# Patient Record
Sex: Male | Born: 1945 | Race: Black or African American | Hispanic: No | Marital: Single
Health system: Southern US, Community
[De-identification: ages and names within clinical notes are randomized; demographics above are authoritative.]

## PROBLEM LIST (undated history)

## (undated) DIAGNOSIS — E119 Type 2 diabetes mellitus without complications: Secondary | ICD-10-CM

## (undated) DIAGNOSIS — F0390 Unspecified dementia without behavioral disturbance: Secondary | ICD-10-CM

---

## 2015-09-11 ENCOUNTER — Inpatient Hospital Stay (HOSPITAL_COMMUNITY)
Admission: EM | Admit: 2015-09-11 | Discharge: 2015-09-21 | DRG: 871 | Disposition: E | Payer: Medicare (Managed Care) | Attending: Oncology | Admitting: Oncology

## 2015-09-11 ENCOUNTER — Emergency Department (HOSPITAL_COMMUNITY): Payer: Medicare (Managed Care)

## 2015-09-11 ENCOUNTER — Encounter (HOSPITAL_COMMUNITY): Payer: Self-pay | Admitting: Emergency Medicine

## 2015-09-11 DIAGNOSIS — Z955 Presence of coronary angioplasty implant and graft: Secondary | ICD-10-CM

## 2015-09-11 DIAGNOSIS — E875 Hyperkalemia: Secondary | ICD-10-CM | POA: Diagnosis present

## 2015-09-11 DIAGNOSIS — I214 Non-ST elevation (NSTEMI) myocardial infarction: Secondary | ICD-10-CM | POA: Diagnosis not present

## 2015-09-11 DIAGNOSIS — IMO0002 Reserved for concepts with insufficient information to code with codable children: Secondary | ICD-10-CM

## 2015-09-11 DIAGNOSIS — E1140 Type 2 diabetes mellitus with diabetic neuropathy, unspecified: Secondary | ICD-10-CM | POA: Diagnosis present

## 2015-09-11 DIAGNOSIS — Z89611 Acquired absence of right leg above knee: Secondary | ICD-10-CM

## 2015-09-11 DIAGNOSIS — Z961 Presence of intraocular lens: Secondary | ICD-10-CM | POA: Diagnosis present

## 2015-09-11 DIAGNOSIS — N179 Acute kidney failure, unspecified: Secondary | ICD-10-CM | POA: Diagnosis present

## 2015-09-11 DIAGNOSIS — Z66 Do not resuscitate: Secondary | ICD-10-CM | POA: Diagnosis present

## 2015-09-11 DIAGNOSIS — R4182 Altered mental status, unspecified: Secondary | ICD-10-CM

## 2015-09-11 DIAGNOSIS — F0150 Vascular dementia without behavioral disturbance: Secondary | ICD-10-CM | POA: Diagnosis present

## 2015-09-11 DIAGNOSIS — K219 Gastro-esophageal reflux disease without esophagitis: Secondary | ICD-10-CM | POA: Diagnosis present

## 2015-09-11 DIAGNOSIS — N400 Enlarged prostate without lower urinary tract symptoms: Secondary | ICD-10-CM | POA: Diagnosis present

## 2015-09-11 DIAGNOSIS — I2510 Atherosclerotic heart disease of native coronary artery without angina pectoris: Secondary | ICD-10-CM | POA: Diagnosis present

## 2015-09-11 DIAGNOSIS — F0390 Unspecified dementia without behavioral disturbance: Secondary | ICD-10-CM | POA: Diagnosis present

## 2015-09-11 DIAGNOSIS — I420 Dilated cardiomyopathy: Secondary | ICD-10-CM | POA: Diagnosis present

## 2015-09-11 DIAGNOSIS — E119 Type 2 diabetes mellitus without complications: Secondary | ICD-10-CM

## 2015-09-11 DIAGNOSIS — Z794 Long term (current) use of insulin: Secondary | ICD-10-CM

## 2015-09-11 DIAGNOSIS — E1122 Type 2 diabetes mellitus with diabetic chronic kidney disease: Secondary | ICD-10-CM | POA: Diagnosis present

## 2015-09-11 DIAGNOSIS — K7290 Hepatic failure, unspecified without coma: Secondary | ICD-10-CM | POA: Diagnosis present

## 2015-09-11 DIAGNOSIS — Z923 Personal history of irradiation: Secondary | ICD-10-CM

## 2015-09-11 DIAGNOSIS — Z89612 Acquired absence of left leg above knee: Secondary | ICD-10-CM

## 2015-09-11 DIAGNOSIS — Z89512 Acquired absence of left leg below knee: Secondary | ICD-10-CM

## 2015-09-11 DIAGNOSIS — F329 Major depressive disorder, single episode, unspecified: Secondary | ICD-10-CM | POA: Diagnosis present

## 2015-09-11 DIAGNOSIS — I472 Ventricular tachycardia, unspecified: Secondary | ICD-10-CM | POA: Insufficient documentation

## 2015-09-11 DIAGNOSIS — E861 Hypovolemia: Secondary | ICD-10-CM | POA: Diagnosis present

## 2015-09-11 DIAGNOSIS — K759 Inflammatory liver disease, unspecified: Secondary | ICD-10-CM | POA: Diagnosis not present

## 2015-09-11 DIAGNOSIS — Z7982 Long term (current) use of aspirin: Secondary | ICD-10-CM

## 2015-09-11 DIAGNOSIS — R6520 Severe sepsis without septic shock: Secondary | ICD-10-CM

## 2015-09-11 DIAGNOSIS — I4901 Ventricular fibrillation: Secondary | ICD-10-CM | POA: Diagnosis not present

## 2015-09-11 DIAGNOSIS — I252 Old myocardial infarction: Secondary | ICD-10-CM

## 2015-09-11 DIAGNOSIS — Z85118 Personal history of other malignant neoplasm of bronchus and lung: Secondary | ICD-10-CM

## 2015-09-11 DIAGNOSIS — I130 Hypertensive heart and chronic kidney disease with heart failure and stage 1 through stage 4 chronic kidney disease, or unspecified chronic kidney disease: Secondary | ICD-10-CM | POA: Diagnosis present

## 2015-09-11 DIAGNOSIS — I509 Heart failure, unspecified: Secondary | ICD-10-CM | POA: Diagnosis present

## 2015-09-11 DIAGNOSIS — S36119A Unspecified injury of liver, initial encounter: Secondary | ICD-10-CM | POA: Diagnosis present

## 2015-09-11 DIAGNOSIS — E11319 Type 2 diabetes mellitus with unspecified diabetic retinopathy without macular edema: Secondary | ICD-10-CM | POA: Diagnosis present

## 2015-09-11 DIAGNOSIS — I4729 Other ventricular tachycardia: Secondary | ICD-10-CM | POA: Insufficient documentation

## 2015-09-11 DIAGNOSIS — R6521 Severe sepsis with septic shock: Secondary | ICD-10-CM

## 2015-09-11 DIAGNOSIS — E860 Dehydration: Secondary | ICD-10-CM | POA: Diagnosis present

## 2015-09-11 DIAGNOSIS — A419 Sepsis, unspecified organism: Principal | ICD-10-CM | POA: Diagnosis present

## 2015-09-11 DIAGNOSIS — K7200 Acute and subacute hepatic failure without coma: Secondary | ICD-10-CM | POA: Diagnosis present

## 2015-09-11 DIAGNOSIS — E872 Acidosis: Secondary | ICD-10-CM | POA: Diagnosis present

## 2015-09-11 DIAGNOSIS — N189 Chronic kidney disease, unspecified: Secondary | ICD-10-CM | POA: Diagnosis present

## 2015-09-11 DIAGNOSIS — R400 Somnolence: Secondary | ICD-10-CM

## 2015-09-11 DIAGNOSIS — E1151 Type 2 diabetes mellitus with diabetic peripheral angiopathy without gangrene: Secondary | ICD-10-CM | POA: Diagnosis present

## 2015-09-11 DIAGNOSIS — I255 Ischemic cardiomyopathy: Secondary | ICD-10-CM | POA: Diagnosis present

## 2015-09-11 DIAGNOSIS — Z8673 Personal history of transient ischemic attack (TIA), and cerebral infarction without residual deficits: Secondary | ICD-10-CM

## 2015-09-11 HISTORY — DX: Unspecified dementia, unspecified severity, without behavioral disturbance, psychotic disturbance, mood disturbance, and anxiety: F03.90

## 2015-09-11 HISTORY — DX: Type 2 diabetes mellitus without complications: E11.9

## 2015-09-11 LAB — TYPE AND SCREEN
ABO/RH(D): B POS
Antibody Screen: NEGATIVE

## 2015-09-11 LAB — URINALYSIS, ROUTINE W REFLEX MICROSCOPIC
Glucose, UA: NEGATIVE mg/dL
Ketones, ur: 15 mg/dL — AB
Nitrite: NEGATIVE
PH: 5 (ref 5.0–8.0)
Protein, ur: 30 mg/dL — AB
SPECIFIC GRAVITY, URINE: 1.021 (ref 1.005–1.030)

## 2015-09-11 LAB — I-STAT VENOUS BLOOD GAS, ED
ACID-BASE DEFICIT: 5 mmol/L — AB (ref 0.0–2.0)
Bicarbonate: 20.1 mEq/L (ref 20.0–24.0)
O2 Saturation: 78 %
PCO2 VEN: 34.7 mmHg — AB (ref 45.0–50.0)
PO2 VEN: 43 mmHg (ref 30.0–45.0)
TCO2: 21 mmol/L (ref 0–100)
pH, Ven: 7.37 — ABNORMAL HIGH (ref 7.250–7.300)

## 2015-09-11 LAB — CBC WITH DIFFERENTIAL/PLATELET
BAND NEUTROPHILS: 0 %
BASOS ABS: 0 10*3/uL (ref 0.0–0.1)
Basophils Relative: 0 %
Blasts: 0 %
EOS ABS: 0 10*3/uL (ref 0.0–0.7)
EOS PCT: 0 %
HCT: 26.3 % — ABNORMAL LOW (ref 39.0–52.0)
Hemoglobin: 8.1 g/dL — ABNORMAL LOW (ref 13.0–17.0)
LYMPHS ABS: 2.2 10*3/uL (ref 0.7–4.0)
Lymphocytes Relative: 12 %
MCH: 27.6 pg (ref 26.0–34.0)
MCHC: 30.8 g/dL (ref 30.0–36.0)
MCV: 89.8 fL (ref 78.0–100.0)
METAMYELOCYTES PCT: 0 %
MONOS PCT: 8 %
Monocytes Absolute: 1.4 10*3/uL — ABNORMAL HIGH (ref 0.1–1.0)
Myelocytes: 0 %
NEUTROS ABS: 14.5 10*3/uL — AB (ref 1.7–7.7)
Neutrophils Relative %: 80 %
Other: 0 %
PLATELETS: 141 10*3/uL — AB (ref 150–400)
Promyelocytes Absolute: 0 %
RBC: 2.93 MIL/uL — ABNORMAL LOW (ref 4.22–5.81)
RDW: 16.7 % — AB (ref 11.5–15.5)
WBC: 18.1 10*3/uL — ABNORMAL HIGH (ref 4.0–10.5)
nRBC: 43 /100 WBC — ABNORMAL HIGH

## 2015-09-11 LAB — URINE MICROSCOPIC-ADD ON

## 2015-09-11 LAB — I-STAT TROPONIN, ED: Troponin i, poc: 8.49 ng/mL (ref 0.00–0.08)

## 2015-09-11 LAB — COMPREHENSIVE METABOLIC PANEL
ALK PHOS: 375 U/L — AB (ref 38–126)
ALT: 2249 U/L — AB (ref 17–63)
AST: 2584 U/L — ABNORMAL HIGH (ref 15–41)
Albumin: 2.7 g/dL — ABNORMAL LOW (ref 3.5–5.0)
Anion gap: 19 — ABNORMAL HIGH (ref 5–15)
BUN: 105 mg/dL — ABNORMAL HIGH (ref 6–20)
CALCIUM: 7.8 mg/dL — AB (ref 8.9–10.3)
CO2: 18 mmol/L — AB (ref 22–32)
CREATININE: 3.25 mg/dL — AB (ref 0.61–1.24)
Chloride: 101 mmol/L (ref 101–111)
GFR calc non Af Amer: 18 mL/min — ABNORMAL LOW (ref >60)
GFR, EST AFRICAN AMERICAN: 21 mL/min — AB (ref >60)
Glucose, Bld: 167 mg/dL — ABNORMAL HIGH (ref 65–99)
Potassium: 6.8 mmol/L (ref 3.5–5.1)
SODIUM: 138 mmol/L (ref 135–145)
Total Bilirubin: 5.5 mg/dL — ABNORMAL HIGH (ref 0.3–1.2)
Total Protein: 6.4 g/dL — ABNORMAL LOW (ref 6.5–8.1)

## 2015-09-11 LAB — AMMONIA: AMMONIA: 35 umol/L (ref 9–35)

## 2015-09-11 LAB — PROTIME-INR
INR: 2.43 — AB (ref 0.00–1.49)
Prothrombin Time: 26.1 seconds — ABNORMAL HIGH (ref 11.6–15.2)

## 2015-09-11 LAB — LIPASE, BLOOD: Lipase: 61 U/L — ABNORMAL HIGH (ref 11–51)

## 2015-09-11 LAB — I-STAT CHEM 8, ED
BUN: 99 mg/dL — AB (ref 6–20)
CALCIUM ION: 0.94 mmol/L — AB (ref 1.13–1.30)
CHLORIDE: 100 mmol/L — AB (ref 101–111)
Creatinine, Ser: 3.3 mg/dL — ABNORMAL HIGH (ref 0.61–1.24)
GLUCOSE: 161 mg/dL — AB (ref 65–99)
HEMATOCRIT: 27 % — AB (ref 39.0–52.0)
Hemoglobin: 9.2 g/dL — ABNORMAL LOW (ref 13.0–17.0)
Potassium: 6.4 mmol/L (ref 3.5–5.1)
Sodium: 136 mmol/L (ref 135–145)
TCO2: 19 mmol/L (ref 0–100)

## 2015-09-11 LAB — I-STAT CG4 LACTIC ACID, ED: LACTIC ACID, VENOUS: 8.55 mmol/L — AB (ref 0.5–2.0)

## 2015-09-11 LAB — ABO/RH: ABO/RH(D): B POS

## 2015-09-11 LAB — CBG MONITORING, ED: Glucose-Capillary: 133 mg/dL — ABNORMAL HIGH (ref 65–99)

## 2015-09-11 LAB — POC OCCULT BLOOD, ED: Fecal Occult Bld: NEGATIVE

## 2015-09-11 LAB — BRAIN NATRIURETIC PEPTIDE

## 2015-09-11 MED ORDER — ASPIRIN 300 MG RE SUPP
300.0000 mg | Freq: Once | RECTAL | Status: AC
  Administered 2015-09-11: 300 mg via RECTAL
  Filled 2015-09-11: qty 1

## 2015-09-11 MED ORDER — PIPERACILLIN-TAZOBACTAM 3.375 G IVPB 30 MIN
3.3750 g | Freq: Once | INTRAVENOUS | Status: AC
  Administered 2015-09-11: 3.375 g via INTRAVENOUS
  Filled 2015-09-11: qty 50

## 2015-09-11 MED ORDER — CALCIUM GLUCONATE 10 % IV SOLN
1.0000 g | Freq: Once | INTRAVENOUS | Status: AC
  Administered 2015-09-11: 1 g via INTRAVENOUS
  Filled 2015-09-11: qty 10

## 2015-09-11 MED ORDER — INSULIN ASPART 100 UNIT/ML IV SOLN
10.0000 [IU] | Freq: Once | INTRAVENOUS | Status: AC
  Administered 2015-09-11: 10 [IU] via INTRAVENOUS
  Filled 2015-09-11: qty 1

## 2015-09-11 MED ORDER — SODIUM CHLORIDE 0.9 % IV SOLN: 1000.0000 mL | INTRAVENOUS | Status: DC

## 2015-09-11 MED ORDER — HEPARIN (PORCINE) IN NACL 100-0.45 UNIT/ML-% IJ SOLN
700.0000 [IU]/h | INTRAMUSCULAR | Status: DC
  Administered 2015-09-11: 700 [IU]/h via INTRAVENOUS
  Filled 2015-09-11: qty 250

## 2015-09-11 MED ORDER — SODIUM CHLORIDE 0.9 % IV BOLUS (SEPSIS): 500.0000 mL | Freq: Once | INTRAVENOUS | Status: DC

## 2015-09-11 MED ORDER — ALBUTEROL SULFATE (2.5 MG/3ML) 0.083% IN NEBU
10.0000 mg | INHALATION_SOLUTION | Freq: Once | RESPIRATORY_TRACT | Status: AC
  Administered 2015-09-11: 10 mg via RESPIRATORY_TRACT
  Filled 2015-09-11: qty 12

## 2015-09-11 MED ORDER — SODIUM CHLORIDE 0.9 % IV BOLUS (SEPSIS)
500.0000 mL | Freq: Once | INTRAVENOUS | Status: AC
  Administered 2015-09-11: 500 mL via INTRAVENOUS

## 2015-09-11 MED ORDER — ASPIRIN 81 MG PO CHEW: 324.0000 mg | CHEWABLE_TABLET | Freq: Once | ORAL | Status: DC

## 2015-09-11 MED ORDER — SODIUM CHLORIDE 0.9 % IV SOLN
1000.0000 mL | Freq: Once | INTRAVENOUS | Status: AC
  Administered 2015-09-12: 1000 mL via INTRAVENOUS

## 2015-09-11 MED ORDER — SODIUM CHLORIDE 0.9 % IV SOLN
1000.0000 mL | Freq: Once | INTRAVENOUS | Status: AC
  Administered 2015-09-11: 1000 mL via INTRAVENOUS

## 2015-09-11 MED ORDER — VANCOMYCIN HCL IN DEXTROSE 1-5 GM/200ML-% IV SOLN
1000.0000 mg | INTRAVENOUS | Status: AC
  Administered 2015-09-12: 1000 mg via INTRAVENOUS
  Filled 2015-09-11: qty 200

## 2015-09-11 MED ORDER — PIPERACILLIN-TAZOBACTAM IN DEX 2-0.25 GM/50ML IV SOLN
2.2500 g | Freq: Three times a day (TID) | INTRAVENOUS | Status: DC
  Administered 2015-09-12 – 2015-09-13 (×6): 2.25 g via INTRAVENOUS
  Filled 2015-09-11 (×11): qty 50

## 2015-09-11 MED ORDER — HEPARIN BOLUS VIA INFUSION
3000.0000 [IU] | Freq: Once | INTRAVENOUS | Status: AC
  Administered 2015-09-11: 3000 [IU] via INTRAVENOUS
  Filled 2015-09-11: qty 3000

## 2015-09-11 MED ORDER — VANCOMYCIN HCL IN DEXTROSE 1-5 GM/200ML-% IV SOLN
1000.0000 mg | INTRAVENOUS | Status: DC
  Administered 2015-09-13: 1000 mg via INTRAVENOUS
  Filled 2015-09-11: qty 200

## 2015-09-11 MED ORDER — SODIUM POLYSTYRENE SULFONATE 15 GM/60ML PO SUSP: 30.0000 g | Freq: Once | ORAL | Status: DC

## 2015-09-11 MED ORDER — DEXTROSE 50 % IV SOLN
1.0000 | Freq: Once | INTRAVENOUS | Status: AC
  Administered 2015-09-11: 50 mL via INTRAVENOUS
  Filled 2015-09-11: qty 50

## 2015-09-11 NOTE — ED Notes (Signed)
Spoke with Edson Snowballngelina, daughter, sts she makes her father's healthcare decisions. Sts father is full code. Sts she will be here in the morning to see him.

## 2015-09-11 NOTE — Progress Notes (Signed)
ANTICOAGULATION CONSULT NOTE - Initial Consult  Pharmacy Consult for heparin Indication: chest pain/ACS  No Known Allergies  Patient Measurements:   Heparin Dosing Weight: 59.8kg  Vital Signs: Temp: 97.4 F (36.3 C) (11/21 1744) Temp Source: Oral (11/21 1744) BP: 97/31 mmHg (11/21 2000) Pulse Rate: 92 (11/21 2030)  Labs:  Recent Labs  June 21, 2015 1930 June 21, 2015 1939  HGB 8.1* 9.2*  HCT 26.3* 27.0*  PLT 141*  --   LABPROT 26.1*  --   INR 2.43*  --   CREATININE  --  3.30*    CrCl cannot be calculated (Unknown ideal weight.).   Medical History: Past Medical History  Diagnosis Date  . Dementia   . Diabetes mellitus without complication (HCC)     Assessment: 69 yom to ED with AMS, elevated troponin. Pharmacy consulted to dose heparin for ACS/CP (no anticoag pta). Patient has bilat AKA. Hg 9.2, plt 141 on admit. No bleed documented.  Goal of Therapy:  Heparin level 0.3-0.7 units/ml Monitor platelets by anticoagulation protocol: Yes   Plan:  Heparin 3000 unit bolus Heparin @ 700 units/h 6h HL Daily HL/CBC Mon s/sx bleeding  Babs BertinHaley Thorn Demas, PharmD Clinical Pharmacist Pager [phone] 2015/07/11 8:45 PM

## 2015-09-11 NOTE — ED Provider Notes (Signed)
CSN: [id number]     Arrival date & time 09/10/2015  1732 History   First MD Initiated Contact with Patient 09/17/2015 1734     Chief Complaint  Patient presents with  . Altered Mental Status     (Consider location/radiation/quality/duration/timing/severity/associated sxs/prior Treatment) HPI The patient comes from a facility called [facility]. He has just recently become a patient with them. This is an adult day care facility with medical care associated. Patient has multiple, complex medical history. This includes diabetes, coronary artery disease, bilateral lower extremity amputations, dementia. At baseline however the patient is pleasantly interactive and conversant. A distinct change was noted and the patient today. I reviewed his case with Dr. Mayford Knife who evaluated the patient at this facility. She reports that the patient's daughter noted some changes over the weekend. She had the impression he was constipated and thus he was given over-the-counter preparation. Since then he has had several loose stools and been incontinent of stool. Dr. Mayford Knife reports that the patient seemed clinically dehydrated and they had gotten labs as well indicating renal insufficiency and hyperkalemia. While at the facility she reports he drank 2 L of fluids eagerly. They also noted EKG abnormality and have sent the patient to the emergency department for treatment. Up to this point, the patient's daughter has not been present for additional history from the weekend. Past Medical History  Diagnosis Date  . Dementia   . Diabetes mellitus without complication (HCC)    History reviewed. No pertinent past surgical history. History reviewed. No pertinent family history. Social History  Substance Use Topics  . Smoking status: Unknown If Ever Smoked  . Smokeless tobacco: None  . Alcohol Use: None    Review of Systems Patient cannot provide, level V caveat dementia/severe illness   Allergies  Review of patient's  allergies indicates no known allergies.  Home Medications   Prior to Admission medications   Medication Sig Start Date End Date Taking? Authorizing Provider  acetaminophen (TYLENOL) 500 MG tablet Take 500 mg by mouth every 4 (four) hours as needed for mild pain.   Yes Historical Provider, MD  aspirin 81 MG tablet Take 81 mg by mouth daily.   Yes Historical Provider, MD  DULoxetine (CYMBALTA) 30 MG capsule Take 30 mg by mouth daily.   Yes Historical Provider, MD  famotidine (PEPCID) 20 MG tablet Take 20 mg by mouth 2 (two) times daily.   Yes Historical Provider, MD  insulin glargine (LANTUS) 100 UNIT/ML injection Inject 25 Units into the skin at bedtime.    Yes Historical Provider, MD  lisinopril (PRINIVIL,ZESTRIL) 5 MG tablet Take 5 mg by mouth daily.   Yes Historical Provider, MD  metFORMIN (GLUCOPHAGE) 1000 MG tablet Take 1,000 mg by mouth 2 (two) times daily with a meal.   Yes Historical Provider, MD  metoprolol succinate (TOPROL-XL) 50 MG 24 hr tablet Take 50 mg by mouth daily. Take with or immediately following a meal.   Yes Historical Provider, MD  nicotine (NICODERM CQ - DOSED IN MG/24 HR) 7 mg/24hr patch Place 7 mg onto the skin daily.   Yes Historical Provider, MD  Nutritional Supplements (FEEDING SUPPLEMENT, GLUCERNA 1.2 CAL,) LIQD Place 237 mLs into feeding tube 2 (two) times daily.   Yes Historical Provider, MD  polyethylene glycol (MIRALAX / GLYCOLAX) packet Take 17 g by mouth daily.   Yes Historical Provider, MD  pravastatin (PRAVACHOL) 40 MG tablet Take 40 mg by mouth at bedtime.   Yes Historical Provider, MD  senna (SENOKOT) 8.6 MG tablet Take 1 tablet by mouth at bedtime.   Yes Historical Provider, MD   BP 127/52 mmHg  Pulse 94  Temp(Src) 97.4 F (36.3 C) (Oral)  Resp 16  Wt 131 lb 13.4 oz (59.8 kg)  SpO2 100% Physical Exam  Constitutional:  Patient is very thin, confused, no respiratory distress. Pale appearance.  HENT:  Head: Normocephalic and atraumatic.  Right  Ear: External ear normal.  Left Ear: External ear normal.  Oropharynx is slightly dry but clear. Patient is making intermittent vocalizations.  Eyes: EOM are normal. Pupils are equal, round, and reactive to light. Scleral icterus is present.  Neck: Neck supple.  Cardiovascular: Normal rate, regular rhythm, normal heart sounds and intact distal pulses.   Pulmonary/Chest: Effort normal and breath sounds normal.  Abdominal: Soft. He exhibits no distension. There is tenderness.  Patient seems uncomfortable palpation but cannot seem to localize.  Genitourinary:  Rectal examination: Soft pale brownish yellow stool in the vault. No impaction.  Musculoskeletal: Normal range of motion. He exhibits no edema.  Patient can move all extremities. He has both lower extremities amputated. They have dictation sites are clean and dry and intact. There is no edema. His back and buttocks are good condition without wounds or breakdown.  Neurological: He has normal strength. GCS eye subscore is 4. GCS verbal subscore is 5. GCS motor subscore is 6.  Patient is confused. Makes occasional vocalizations it makes sense but cannot answer questions. He predominantly perseverates the last thing said. He does assistance him moving about in the stretcher. He spontaneously moves his extremities. He is too confused to follow commands for extremity motor testing.  Skin: Skin is dry and intact.  Patient is pale in appearance, extremities are slightly cool to touch.  Psychiatric:  Patient is cooperative but confused.    ED Course  Procedures (including critical care time) CRITICAL CARE Performed by: Arby Barrette   Total critical care time: 60 minutes  Critical care time was exclusive of separately billable procedures and treating other patients.  Critical care was necessary to treat or prevent imminent or life-threatening deterioration.  Critical care was time spent personally by me on the following activities:  development of treatment plan with patient and/or surrogate as well as nursing, discussions with consultants, evaluation of patient's response to treatment, examination of patient, obtaining history from patient or surrogate, ordering and performing treatments and interventions, ordering and review of laboratory studies, ordering and review of radiographic studies, pulse oximetry and re-evaluation of patient's condition. Labs Review Labs Reviewed  COMPREHENSIVE METABOLIC PANEL - Abnormal; Notable for the following:    Potassium 6.8 (*)    CO2 18 (*)    Glucose, Bld 167 (*)    BUN 105 (*)    Creatinine, Ser 3.25 (*)    Calcium 7.8 (*)    Total Protein 6.4 (*)    Albumin 2.7 (*)    AST 2584 (*)    ALT 2249 (*)    Alkaline Phosphatase 375 (*)    Total Bilirubin 5.5 (*)    GFR calc non Af Amer 18 (*)    GFR calc Af Amer 21 (*)    Anion gap 19 (*)    All other components within normal limits  LIPASE, BLOOD - Abnormal; Notable for the following:    Lipase 61 (*)    All other components within normal limits  BRAIN NATRIURETIC PEPTIDE - Abnormal; Notable for the following:    B Natriuretic Peptide >  4500.0 (*)    All other components within normal limits  CBC WITH DIFFERENTIAL/PLATELET - Abnormal; Notable for the following:    WBC 18.1 (*)    RBC 2.93 (*)    Hemoglobin 8.1 (*)    HCT 26.3 (*)    RDW 16.7 (*)    Platelets 141 (*)    nRBC 43 (*)    Neutro Abs 14.5 (*)    Monocytes Absolute 1.4 (*)    All other components within normal limits  PROTIME-INR - Abnormal; Notable for the following:    Prothrombin Time 26.1 (*)    INR 2.43 (*)    All other components within normal limits  URINALYSIS, ROUTINE W REFLEX MICROSCOPIC (NOT AT [facility]) - Abnormal; Notable for the following:    Color, Urine AMBER (*)    APPearance CLOUDY (*)    Hgb urine dipstick SMALL (*)    Bilirubin Urine MODERATE (*)    Ketones, ur 15 (*)    Protein, ur 30 (*)    Leukocytes, UA SMALL (*)    All other  components within normal limits  URINE MICROSCOPIC-ADD ON - Abnormal; Notable for the following:    Squamous Epithelial / LPF 0-5 (*)    Bacteria, UA RARE (*)    Casts GRANULAR CAST (*)    All other components within normal limits  CBG MONITORING, ED - Abnormal; Notable for the following:    Glucose-Capillary 133 (*)    All other components within normal limits  I-STAT TROPOININ, ED - Abnormal; Notable for the following:    Troponin i, poc 8.49 (*)    All other components within normal limits  I-STAT CHEM 8, ED - Abnormal; Notable for the following:    Potassium 6.4 (*)    Chloride 100 (*)    BUN 99 (*)    Creatinine, Ser 3.30 (*)    Glucose, Bld 161 (*)    Calcium, Ion 0.94 (*)    Hemoglobin 9.2 (*)    HCT 27.0 (*)    All other components within normal limits  I-STAT VENOUS BLOOD GAS, ED - Abnormal; Notable for the following:    pH, Ven 7.370 (*)    pCO2, Ven 34.7 (*)    Acid-base deficit 5.0 (*)    All other components within normal limits  I-STAT CG4 LACTIC ACID, ED - Abnormal; Notable for the following:    Lactic Acid, Venous 8.55 (*)    All other components within normal limits  CULTURE, BLOOD (ROUTINE X 2)  CULTURE, BLOOD (ROUTINE X 2)  AMMONIA  BLOOD GAS, VENOUS  PROCALCITONIN  CBG MONITORING, ED  POC OCCULT BLOOD, ED  TYPE AND SCREEN  ABO/RH    Imaging Review Dg Chest Port 1 View  09/15/2015  CLINICAL DATA:  69 year old male with altered mental status EXAM: PORTABLE CHEST 1 VIEW COMPARISON:  None. FINDINGS: Mild cardiomegaly with left heart enlargement. Atherosclerotic calcifications present in the transverse aorta. The patient is rotated to the left which distorts the cardiac and mediastinal contours. Mediastinal contours are grossly within normal limits given this distortion. Low inspiratory volumes with mild bibasilar atelectasis. No pulmonary edema, pleural effusion or pneumothorax. No focal airspace consolidation. No acute osseous abnormality. IMPRESSION: 1.  Low inspiratory volumes with mild bibasilar atelectasis. 2. Borderline cardiomegaly. 3. Aortic atherosclerosis. Electronically Signed   By: Malachy Moan M.D.   On: 09/02/2015 18:12   I have personally reviewed and evaluated these images and lab results as part of my medical decision-making.  EKG Interpretation   Date/Time:  Monday September 11 2015 17:33:23 EST Ventricular Rate:  84 PR Interval:  168 QRS Duration: 111 QT Interval:  416 QTC Calculation: 492 R Axis:   112 Text Interpretation:  Sinus rhythm Probable right ventricular hypertrophy  Inferior infarct, age indeterminate Repol abnrm, severe global ischemia  (LM/MVD) ischemic changes. Confirmed by Donnald GarrePfeiffer, MD, Lebron ConnersMarcy [phone]([id number]) on  May 19, 2015 5:36:20 PM      MDM   Final diagnoses:  Multisystem organ failure   Patient is critically ill. Suspicion is for ascending cholangitis as a primary source of infection. A she is significantly elevated LFTs with concomitant multisystem organ failure. EKG has ischemic appearance and troponin is significant only elevated. Patient has acute renal failure and hyperkalemia. Intensivist was consult it and evaluated the patient. Dr. Gomez CleverlyBurnham has discussed the patient's case with the patient's daughter who has not been present yet. She has been made aware of the severity of her father's illness and determined that at this time he will be DO NOT RESUSCITATE for no intubation, no central lines and no pressors. Patient will be admitted to the internal medicine resident service for temporizing, supportive measures.    Arby BarretteMarcy Fairley Copher, MD 09/12/15 [phone]

## 2015-09-11 NOTE — ED Notes (Signed)
[facility] with c/o altered LOC. According to facility pt is normally talkative, not at this time. Pt groaing at this time. Lab work from facility shows increased WBC count, decreased HGB, increased potassium. Pt also has EKG changes. Pt sts pain in abdomen, chest. Hypotension noted upon arrival.

## 2015-09-11 NOTE — Consult Note (Addendum)
CARDIOLOGY CONSULT NOTE   Patient ID: Gary Wagner MRN: [id number], DOB/AGE: Feb 28, 1946   Admit date: 09/13/2015 Date of Consult: 08/24/2015   Primary Physician: No PCP Per Patient Primary Cardiologist: None  Pt. 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   Problem List  Past Medical History  Diagnosis Date  . Dementia   . Diabetes mellitus without complication ([place])     History reviewed. No pertinent past surgical history.   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   Gary Wagner is a resident at [facility]. At baseline, he needs some assistance with grooming, transfers. Can bathe himself for the most part. Unable to cook, shop, do finances, laundry, or housekeeping. Per adult daycare paperwork, he is typically an accurate historian and is interactive. The patient's daughter noted some small changes over the weekend with was thought he was constipated and was given some OTC laxatives.Staff noticed a more distinct mental status change today and he was evaluated by a physician at the facility. There was concern that the patient was dehydrated. Labs were sent and the patient was given oral fluids (2L) which he apparently consumed quickly. Labs demonstrated AKI, metabolic disarray with hyperkalemia and ECG  changes with peaked t waves, and the patient was transferred to [facility] for further evaluation.    On arrival to the ER, he was hypotensive to 90/66, P 82, 97.60F, 95% on RA. He would intermittent respond to verbal stimuli and was unable to follow any commands. Labs were notable for K 6.8, Cr 3.25, BUN 105, Alk phos 375, AST 2584, ALT 2249, albumin 2.7, TB 5.5, POC TnI 8.49, WBC 18.1, hgb 8.1, INR 2.43, BNP >4500. UA with small LE, negative nitrate, moderate granular casts, 0-5WBCs. ECG demonstrated NSR, prolonged QTc, some peaking of T waves, ST depression in lateral leads, old MI (inferior Q waves) with probably posterior extension (prominent R wave in V1). 69m STE in aVR. CXR demonstrated no acute process. I was unable to obtain any history or ROS given the degree of AMS.   Inpatient Medications     Family History History reviewed. No pertinent family history.   Social History Social History   Social History  . Marital Status: Single    Spouse Name: N/A  . Number of Children: N/A  . Years of Education: N/A   Occupational History  . Not on file.   Social History Main Topics  . Smoking status: Unknown If Ever Smoked  . Smokeless tobacco: Not on file  . Alcohol Use: Not on file  . Drug Use: Not on file  . Sexual Activity: Not on file   Other Topics Concern  . Not on file   Social History Narrative  . No narrative on file     Review of  Systems  Unable to perform  Physical Exam  Blood pressure 97/31, pulse 92, temperature 97.4 F (36.3 C), temperature source Oral, resp. rate 21, SpO2 59 %.  General: Thin, frail,  Psych: Tired, confused Neuro: intermittently responding (non-sensically) to voice HEENT: Normal  Neck: Supple, no JVD. Lungs:  Resp regular and unlabored, CTA. Heart: RRR no s3, s4, or murmurs. Abdomen: Soft, non-tender, non-distended, BS + x 4.  Extremities: s/p L BKA and R AKA. Well healed stumps. Warm extremities w/o edema.    Labs  No results for  input(s): CKTOTAL, CKMB, TROPONINI in the last 72 hours. Lab Results  Component Value Date   WBC 18.1* 09/02/2015   HGB 9.2* 09/09/2015   HCT 27.0* 09/07/2015   MCV 89.8 09/08/2015   PLT 141* 09/08/2015    Recent Labs Lab 09/17/2015 1930 09/10/2015 1939  NA 138 136  K 6.8* 6.4*  CL 101 100*  CO2 18*  --   BUN 105* 99*  CREATININE 3.25* 3.30*  CALCIUM 7.8*  --   PROT 6.4*  --   BILITOT 5.5*  --   ALKPHOS 375*  --   ALT PENDING  --   AST PENDING  --   GLUCOSE 167* 161*   No results found for: CHOL, HDL, LDLCALC, TRIG No results found for: DDIMER  Radiology/Studies  Dg Chest Port 1 View  08/27/2015  CLINICAL DATA:  69 year old male with altered mental status EXAM: PORTABLE CHEST 1 VIEW COMPARISON:  None. FINDINGS: Mild cardiomegaly with left heart enlargement. Atherosclerotic calcifications present in the transverse aorta. The patient is rotated to the left which distorts the cardiac and mediastinal contours. Mediastinal contours are grossly within normal limits given this distortion. Low inspiratory volumes with mild bibasilar atelectasis. No pulmonary edema, pleural effusion or pneumothorax. No focal airspace consolidation. No acute osseous abnormality. IMPRESSION: 1. Low inspiratory volumes with mild bibasilar atelectasis. 2. Borderline cardiomegaly. 3. Aortic atherosclerosis. Electronically Signed   By: Jacqulynn Cadet M.D.   On: 09/02/2015 18:12    ECG  NSR, prolonged QTc, some peaking of T waves, ST depression in lateral leads, old MI (inferior Q waves) with probably posterior extension (prominent R wave in V1). 69m STE in aVR.  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 It is likely that the NSTEMI is secondary to systemic hypoperfusion and the degree of troponin elevated related in part to the AKI. He appears to be hypovolemic and may be infected. AMS is likely driven by metabolic disarray with uremia. He has multiorgan system failure and is a poor candidate for anything other than a conservative medical approach to his NSTEMI.   1. ASA 846mdaily 2. Discontinue IV heparin given elevated INR and acute liver failure 3. No statin given acute liver injury 4. No beta blocker given shock 5. No ACE or spiro due to AKI and shock 6. TTE in AM for risk stratification 7. Although he has a reduced ejection fraction, he appears hypovolemic and would benefit from ~2 liters of additional fluid over the next 12 hours. Will need to keep a close on on respiratory status with IV fluid administration.   Signed, Ajdin Macke,  Lucendia Leard, MD 08/22/2015, 8:59 PM

## 2015-09-11 NOTE — Progress Notes (Signed)
ANTIBIOTIC CONSULT NOTE - INITIAL  Pharmacy Consult for Vancomycin and Zosyn Indication: sepsis (PNA likely source)  No Known Allergies  Patient Measurements: Weight: 131 lb 13.4 oz (59.8 kg)  Vital Signs: Temp: 97.4 F (36.3 C) (11/21 1744) Temp Source: Oral (11/21 1744) BP: 127/52 mmHg (11/21 2045) Pulse Rate: 94 (11/21 2045) Intake/Output from previous day:   Intake/Output from this shift: Total I/O In: -  Out: 175 [Urine:175]  Labs:  Recent Labs  2015-01-11 1930 2015-01-11 1939  WBC 18.1*  --   HGB 8.1* 9.2*  PLT 141*  --   CREATININE 3.25* 3.30*   CrCl cannot be calculated (Unknown ideal weight.). No results for input(s): VANCOTROUGH, VANCOPEAK, VANCORANDOM, GENTTROUGH, GENTPEAK, GENTRANDOM, TOBRATROUGH, TOBRAPEAK, TOBRARND, AMIKACINPEAK, AMIKACINTROU, AMIKACIN in the last 72 hours.   Microbiology: No results found for this or any previous visit (from the past 720 hour(s)).  Medical History: Past Medical History  Diagnosis Date  . Dementia   . Diabetes mellitus without complication (HCC)     Medications:  See electronic med rec  Assessment: 69 y.o. M presents from assisted living facility with confusion. Also found to have NSTEMI and multiple metabolic derangements. To begin vancomycin and Zosyn for sepsis - likely lung source. Zosyn 3.375gm IV given ~2300 in ED. WBC elevated to 18.1. Afeb.  Estimated CrCl 18 ml/min.  Goal of Therapy:  Vancomycin trough level 15-20 mcg/ml  Plan:  Zosyn 2.25gm IV q8h  Vancomycin 1gm IV q48h Will f/u micro data, renal function, and pt's clinical condition Vanc trough prn  Christoper Fabianaron Myana Schlup, PharmD, BCPS Clinical pharmacist, pager [phone] 03-17-15,11:44 PM

## 2015-09-11 NOTE — H&P (Addendum)
PULMONARY / CRITICAL CARE MEDICINE   Name: Gary Wagner MRN: [id number] DOB: 04-08-1946    ADMISSION DATE:  October 08, 2015 CONSULTATION DATE:  Oct 08, 2015  REFERRING MD :  EDP  CHIEF COMPLAINT:  Confusion  INITIAL PRESENTATION:  69 y.o. male with extensive PMH who resides at [facility] assisted living facility, brought to [facility] ED 11/21 with confusion and was found to have multiple metabolic derangements and NSTEMI.  He was evaluated by cardiology in consultation.  PCCM called for possible admission.  Dr. Molli Knock had discussion with pt's daughter Gary Wagner and had extensive discussion explaining pt's current condition, circumstances, and organ failures.  Decision was made to make pt DNR at this point and if pt shows some improvement in mental status and lab work (specifically his multi organ failure), then family will revisit code status in next day or so.   STUDIES:  CXR 11/21 >>> low volumes, bibasilar atx.  SIGNIFICANT EVENTS: 11/21 - admitted with NSTEMI and multiple metabolic derangements.   HISTORY OF PRESENT ILLNESS:  Pt is encephalopathic; therefore, this HPI is obtained from chart review. Gary Wagner is a 69 y.o. 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  He is a resident of [facility] facility and usually needs assistance with daily functioning.   He was brought to [facility] ED 11/21 due to increasing confusion and dehydration and was found to have hypotension with multiple electrolyte derangements including AKI, hyperkalemia, transaminitis, NSTEMI.  He was evaluated by cardiology who recommended ASA daily.  Statin was held due to transaminitis, beta blocker was held due to shock, ACE was held due to AKI and shock.  TTE was ordered for the AM.  Dr. Molli Knock spoke with  pt's daughter, Gary Wagner and had extensive discussion explaining pt's current condition, circumstances, and organ failures.  Decision was made to make pt DNR at this point and if pt shows some improvement in mental status and lab work (specifically his multi organ failure), then family will revisit code status in next day or so.    PAST MEDICAL HISTORY :   has a past medical history of Dementia and Diabetes mellitus without complication (HCC).  has no past surgical history on file. Prior to Admission medications   Medication Sig Start Date End Date Taking? Authorizing Provider  acetaminophen (TYLENOL) 500 MG tablet Take 500 mg by mouth every 4 (four) hours as needed for mild pain.   Yes Historical Provider, MD  aspirin 81 MG tablet Take 81 mg by mouth daily.   Yes Historical Provider, MD  DULoxetine (CYMBALTA) 30 MG capsule Take 30 mg by mouth daily.   Yes Historical Provider, MD  famotidine (PEPCID) 20 MG tablet Take 20 mg by mouth 2 (two) times daily.   Yes Historical Provider, MD  insulin glargine (LANTUS) 100 UNIT/ML injection Inject 25 Units into the skin at bedtime.    Yes Historical Provider, MD  lisinopril (PRINIVIL,ZESTRIL) 5 MG tablet Take 5 mg by mouth daily.   Yes Historical Provider, MD  metFORMIN (GLUCOPHAGE) 1000 MG tablet Take 1,000 mg by mouth 2 (two) times daily with a meal.   Yes Historical Provider, MD  metoprolol succinate (TOPROL-XL) 50 MG 24 hr tablet Take 50 mg by mouth daily. Take with or immediately following  a meal.   Yes Historical Provider, MD  nicotine (NICODERM CQ - DOSED IN MG/24 HR) 7 mg/24hr patch Place 7 mg onto the skin daily.   Yes Historical Provider, MD  Nutritional Supplements (FEEDING SUPPLEMENT, GLUCERNA 1.2 CAL,) LIQD Place 237 mLs into feeding tube 2 (two) times daily.   Yes Historical Provider, MD  polyethylene glycol (MIRALAX / GLYCOLAX) packet Take 17 g by mouth daily.   Yes Historical Provider, MD  pravastatin (PRAVACHOL) 40 MG tablet Take 40 mg  by mouth at bedtime.   Yes Historical Provider, MD  senna (SENOKOT) 8.6 MG tablet Take 1 tablet by mouth at bedtime.   Yes Historical Provider, MD   No Known Allergies  FAMILY HISTORY:  History reviewed. No pertinent family history.  SOCIAL HISTORY:  has no tobacco, alcohol, and drug history on file.  REVIEW OF SYSTEMS:  Unattainable, patient is arousable only to pain.  SUBJECTIVE:   VITAL SIGNS: Temp:  [97.4 F (36.3 C)] 97.4 F (36.3 C) (11/21 1744) Pulse Rate:  [29-94] 94 (11/21 2045) Resp:  [16-22] 16 (11/21 2045) BP: (90-127)/(31-92) 127/52 mmHg (11/21 2045) SpO2:  [59 %-100 %] 100 % (11/21 2045) FiO2 (%):  [94 %] 94 % (11/21 2030) Weight:  [59.8 kg (131 lb 13.4 oz)] 59.8 kg (131 lb 13.4 oz) (11/21 2100) HEMODYNAMICS:   VENTILATOR SETTINGS: Vent Mode:  [-]  FiO2 (%):  [94 %] 94 % INTAKE / OUTPUT: Intake/Output      11/21 0701 - 11/22 0700   Urine (mL/kg/hr) 175   Total Output 175   Net -175         PHYSICAL EXAMINATION: General: Acute on chronically ill appearing male, in NAD. Neuro: A&O x 3, non-focal.  HEENT: [place]/AT. PERRL, sclerae anicteric. Cardiovascular: RRR, no M/R/G.  Lungs: Bibasilar crackles. Abdomen: BS x 4, soft, NT/ND.  Musculoskeletal: No gross deformities, no edema.  Skin: Intact, warm, no rashes.  LABS:  CBC  Recent Labs Lab 2015/05/26 1930 2015/05/26 1939  WBC 18.1*  --   HGB 8.1* 9.2*  HCT 26.3* 27.0*  PLT 141*  --    Coag's  Recent Labs Lab 2015/05/26 1930  INR 2.43*   BMET  Recent Labs Lab 2015/05/26 1930 2015/05/26 1939  NA 138 136  K 6.8* 6.4*  CL 101 100*  CO2 18*  --   BUN 105* 99*  CREATININE 3.25* 3.30*  GLUCOSE 167* 161*   Electrolytes  Recent Labs Lab 2015/05/26 1930  CALCIUM 7.8*   Sepsis Markers  Recent Labs Lab 2015/05/26 2247  LATICACIDVEN 8.55*   ABG No results for input(s): PHART, PCO2ART, PO2ART in the last 168 hours. Liver Enzymes  Recent Labs Lab 2015/05/26 1930  AST 2584*  ALT 2249*   ALKPHOS 375*  BILITOT 5.5*  ALBUMIN 2.7*   Cardiac Enzymes No results for input(s): TROPONINI, PROBNP in the last 168 hours. Glucose  Recent Labs Lab 2015/05/26 1735  GLUCAP 133*    Imaging Dg Chest Port 1 View  03-10-2015  CLINICAL DATA:  69 year old male with altered mental status EXAM: PORTABLE CHEST 1 VIEW COMPARISON:  None. FINDINGS: Mild cardiomegaly with left heart enlargement. Atherosclerotic calcifications present in the transverse aorta. The patient is rotated to the left which distorts the cardiac and mediastinal contours. Mediastinal contours are grossly within normal limits given this distortion. Low inspiratory volumes with mild bibasilar atelectasis. No pulmonary edema, pleural effusion or pneumothorax. No focal airspace consolidation. No acute osseous abnormality. IMPRESSION: 1. Low inspiratory volumes with mild bibasilar  atelectasis. 2. Borderline cardiomegaly. 3. Aortic atherosclerosis. Electronically Signed   By: Malachy Moan M.D.   On: 21-Sep-2015 18:12    ASSESSMENT / PLAN:  69 year old male with an extensive PMH to include dementia, multiple CVAs, now presenting with sepsis, shock, MI, acute renal failure, shocked liver and AMS. Patient is likely dehydrated with an acute MI and sepsis of unknown source, lung most likely with low volume respiration. Urine is interestingly enough clear. I had an extensive conversation of daughter over the phone. The patient wanted full code but not prolonged life support. My concern is that if we start pressors in a patient with significant coronary disease and an active MI that we are more likely than not to cause fatal arrhythmias. So after discussion with her she decided against central line access and pressor use. I also explained that the patient will declare himself over the next few hours if he responds to IVF and abx then we can discuss dialysis but doubtful his heart will be able to take the pressure with an active MI  therefore, she decided against CPR and cardioversion. And given that we also discussed life support which would be unwise given extensive medical history and decided to make patient a full DNR. Will order kayexalate, PCT, 100 ml/hr IVF and 500 ml bolus, vanc/zosyn and make DNR. Ok to admit to SDU with [facility] service for abx and fluid resuscitation, if deteriorates then please notify family and proceed with comfort care. PCCM will be available PRN.  The patient is critically ill with multiple organ systems failure and requires high complexity decision making for assessment and support, frequent evaluation and titration of therapies, application of advanced monitoring technologies and extensive interpretation of multiple databases.   Critical Care Time devoted to patient care services described in this note is 35 Minutes. This time reflects time of care of this signee Dr Koren Bound. This critical care time does not reflect procedure time, or teaching time or supervisory time of PA/NP/Med student/Med Resident etc but could involve care discussion time.  Alyson Reedy, M.D. [facility] Pulmonary/Critical Care Medicine. Pager: [phone]. After hours pager: [phone].

## 2015-09-11 NOTE — ED Notes (Signed)
Daughter Edson Snowballngelina GIll contact info: [phone]. Updated in pt demographics.

## 2015-09-11 NOTE — ED Notes (Signed)
[facility] contact info- [phone]

## 2015-09-11 NOTE — ED Notes (Signed)
Critical care at bedside  

## 2015-09-12 ENCOUNTER — Encounter (HOSPITAL_COMMUNITY): Payer: Self-pay | Admitting: Internal Medicine

## 2015-09-12 DIAGNOSIS — N189 Chronic kidney disease, unspecified: Secondary | ICD-10-CM | POA: Diagnosis present

## 2015-09-12 DIAGNOSIS — K7290 Hepatic failure, unspecified without coma: Secondary | ICD-10-CM | POA: Diagnosis present

## 2015-09-12 DIAGNOSIS — I130 Hypertensive heart and chronic kidney disease with heart failure and stage 1 through stage 4 chronic kidney disease, or unspecified chronic kidney disease: Secondary | ICD-10-CM | POA: Diagnosis present

## 2015-09-12 DIAGNOSIS — E119 Type 2 diabetes mellitus without complications: Secondary | ICD-10-CM

## 2015-09-12 DIAGNOSIS — A419 Sepsis, unspecified organism: Secondary | ICD-10-CM | POA: Diagnosis present

## 2015-09-12 DIAGNOSIS — E872 Acidosis: Secondary | ICD-10-CM | POA: Diagnosis present

## 2015-09-12 DIAGNOSIS — Z89512 Acquired absence of left leg below knee: Secondary | ICD-10-CM | POA: Diagnosis not present

## 2015-09-12 DIAGNOSIS — E1140 Type 2 diabetes mellitus with diabetic neuropathy, unspecified: Secondary | ICD-10-CM | POA: Diagnosis present

## 2015-09-12 DIAGNOSIS — E860 Dehydration: Secondary | ICD-10-CM | POA: Diagnosis present

## 2015-09-12 DIAGNOSIS — Z8673 Personal history of transient ischemic attack (TIA), and cerebral infarction without residual deficits: Secondary | ICD-10-CM | POA: Diagnosis not present

## 2015-09-12 DIAGNOSIS — I4901 Ventricular fibrillation: Secondary | ICD-10-CM | POA: Diagnosis not present

## 2015-09-12 DIAGNOSIS — S36119A Unspecified injury of liver, initial encounter: Secondary | ICD-10-CM | POA: Diagnosis present

## 2015-09-12 DIAGNOSIS — R69 Illness, unspecified: Secondary | ICD-10-CM

## 2015-09-12 DIAGNOSIS — F0390 Unspecified dementia without behavioral disturbance: Secondary | ICD-10-CM | POA: Diagnosis present

## 2015-09-12 DIAGNOSIS — E1122 Type 2 diabetes mellitus with diabetic chronic kidney disease: Secondary | ICD-10-CM | POA: Diagnosis present

## 2015-09-12 DIAGNOSIS — Z85118 Personal history of other malignant neoplasm of bronchus and lung: Secondary | ICD-10-CM | POA: Diagnosis not present

## 2015-09-12 DIAGNOSIS — E1151 Type 2 diabetes mellitus with diabetic peripheral angiopathy without gangrene: Secondary | ICD-10-CM | POA: Diagnosis present

## 2015-09-12 DIAGNOSIS — N400 Enlarged prostate without lower urinary tract symptoms: Secondary | ICD-10-CM | POA: Diagnosis present

## 2015-09-12 DIAGNOSIS — I472 Ventricular tachycardia: Secondary | ICD-10-CM | POA: Diagnosis not present

## 2015-09-12 DIAGNOSIS — R6520 Severe sepsis without septic shock: Secondary | ICD-10-CM

## 2015-09-12 DIAGNOSIS — F329 Major depressive disorder, single episode, unspecified: Secondary | ICD-10-CM | POA: Diagnosis present

## 2015-09-12 DIAGNOSIS — Z89612 Acquired absence of left leg above knee: Secondary | ICD-10-CM

## 2015-09-12 DIAGNOSIS — F0150 Vascular dementia without behavioral disturbance: Secondary | ICD-10-CM | POA: Diagnosis present

## 2015-09-12 DIAGNOSIS — Z7982 Long term (current) use of aspirin: Secondary | ICD-10-CM | POA: Diagnosis not present

## 2015-09-12 DIAGNOSIS — I214 Non-ST elevation (NSTEMI) myocardial infarction: Secondary | ICD-10-CM

## 2015-09-12 DIAGNOSIS — I252 Old myocardial infarction: Secondary | ICD-10-CM | POA: Diagnosis not present

## 2015-09-12 DIAGNOSIS — R6521 Severe sepsis with septic shock: Secondary | ICD-10-CM | POA: Diagnosis present

## 2015-09-12 DIAGNOSIS — E11319 Type 2 diabetes mellitus with unspecified diabetic retinopathy without macular edema: Secondary | ICD-10-CM | POA: Diagnosis present

## 2015-09-12 DIAGNOSIS — Z66 Do not resuscitate: Secondary | ICD-10-CM | POA: Diagnosis present

## 2015-09-12 DIAGNOSIS — F0391 Unspecified dementia with behavioral disturbance: Secondary | ICD-10-CM | POA: Diagnosis not present

## 2015-09-12 DIAGNOSIS — Z923 Personal history of irradiation: Secondary | ICD-10-CM | POA: Diagnosis not present

## 2015-09-12 DIAGNOSIS — E861 Hypovolemia: Secondary | ICD-10-CM | POA: Diagnosis present

## 2015-09-12 DIAGNOSIS — I2510 Atherosclerotic heart disease of native coronary artery without angina pectoris: Secondary | ICD-10-CM | POA: Diagnosis present

## 2015-09-12 DIAGNOSIS — Z961 Presence of intraocular lens: Secondary | ICD-10-CM | POA: Diagnosis present

## 2015-09-12 DIAGNOSIS — I509 Heart failure, unspecified: Secondary | ICD-10-CM | POA: Diagnosis present

## 2015-09-12 DIAGNOSIS — N179 Acute kidney failure, unspecified: Secondary | ICD-10-CM | POA: Diagnosis present

## 2015-09-12 DIAGNOSIS — R4182 Altered mental status, unspecified: Secondary | ICD-10-CM | POA: Diagnosis present

## 2015-09-12 DIAGNOSIS — Z89611 Acquired absence of right leg above knee: Secondary | ICD-10-CM | POA: Diagnosis not present

## 2015-09-12 DIAGNOSIS — I255 Ischemic cardiomyopathy: Secondary | ICD-10-CM | POA: Diagnosis present

## 2015-09-12 DIAGNOSIS — Z955 Presence of coronary angioplasty implant and graft: Secondary | ICD-10-CM | POA: Diagnosis not present

## 2015-09-12 DIAGNOSIS — I420 Dilated cardiomyopathy: Secondary | ICD-10-CM | POA: Diagnosis present

## 2015-09-12 DIAGNOSIS — Z794 Long term (current) use of insulin: Secondary | ICD-10-CM | POA: Diagnosis not present

## 2015-09-12 DIAGNOSIS — E875 Hyperkalemia: Secondary | ICD-10-CM | POA: Diagnosis present

## 2015-09-12 DIAGNOSIS — K7200 Acute and subacute hepatic failure without coma: Secondary | ICD-10-CM | POA: Diagnosis present

## 2015-09-12 DIAGNOSIS — K219 Gastro-esophageal reflux disease without esophagitis: Secondary | ICD-10-CM | POA: Diagnosis present

## 2015-09-12 LAB — GLUCOSE, CAPILLARY
GLUCOSE-CAPILLARY: 216 mg/dL — AB (ref 65–99)
GLUCOSE-CAPILLARY: 216 mg/dL — AB (ref 65–99)
Glucose-Capillary: 213 mg/dL — ABNORMAL HIGH (ref 65–99)
Glucose-Capillary: 226 mg/dL — ABNORMAL HIGH (ref 65–99)
Glucose-Capillary: 199 mg/dL — ABNORMAL HIGH (ref 65–99)

## 2015-09-12 LAB — COMPREHENSIVE METABOLIC PANEL
ALBUMIN: 2.5 g/dL — AB (ref 3.5–5.0)
ALK PHOS: 339 U/L — AB (ref 38–126)
ALT: 1930 U/L — ABNORMAL HIGH (ref 17–63)
AST: 1654 U/L — AB (ref 15–41)
Anion gap: 22 — ABNORMAL HIGH (ref 5–15)
BILIRUBIN TOTAL: 7 mg/dL — AB (ref 0.3–1.2)
BUN: 103 mg/dL — AB (ref 6–20)
CALCIUM: 7.4 mg/dL — AB (ref 8.9–10.3)
CO2: 13 mmol/L — ABNORMAL LOW (ref 22–32)
CREATININE: 3.03 mg/dL — AB (ref 0.61–1.24)
Chloride: 102 mmol/L (ref 101–111)
GFR calc Af Amer: 23 mL/min — ABNORMAL LOW (ref >60)
GFR calc non Af Amer: 20 mL/min — ABNORMAL LOW (ref >60)
GLUCOSE: 230 mg/dL — AB (ref 65–99)
Potassium: 6.5 mmol/L (ref 3.5–5.1)
Sodium: 137 mmol/L (ref 135–145)
TOTAL PROTEIN: 6 g/dL — AB (ref 6.5–8.1)

## 2015-09-12 LAB — BLOOD GAS, VENOUS

## 2015-09-12 LAB — BASIC METABOLIC PANEL
Anion gap: 20 — ABNORMAL HIGH (ref 5–15)
BUN: 110 mg/dL — AB (ref 6–20)
CO2: 12 mmol/L — AB (ref 22–32)
Calcium: 7.1 mg/dL — ABNORMAL LOW (ref 8.9–10.3)
Chloride: 106 mmol/L (ref 101–111)
Creatinine, Ser: 3.19 mg/dL — ABNORMAL HIGH (ref 0.61–1.24)
GFR calc Af Amer: 21 mL/min — ABNORMAL LOW (ref >60)
GFR, EST NON AFRICAN AMERICAN: 18 mL/min — AB (ref >60)
GLUCOSE: 269 mg/dL — AB (ref 65–99)
POTASSIUM: 6.6 mmol/L — AB (ref 3.5–5.1)
Sodium: 138 mmol/L (ref 135–145)

## 2015-09-12 LAB — CBC
HCT: 26.5 % — ABNORMAL LOW (ref 39.0–52.0)
Hemoglobin: 8.1 g/dL — ABNORMAL LOW (ref 13.0–17.0)
MCH: 27.7 pg (ref 26.0–34.0)
MCHC: 30.6 g/dL (ref 30.0–36.0)
MCV: 90.8 fL (ref 78.0–100.0)
PLATELETS: 111 10*3/uL — AB (ref 150–400)
RBC: 2.92 MIL/uL — ABNORMAL LOW (ref 4.22–5.81)
RDW: 17.1 % — AB (ref 11.5–15.5)
WBC: 20.5 10*3/uL — ABNORMAL HIGH (ref 4.0–10.5)

## 2015-09-12 LAB — MRSA PCR SCREENING: MRSA BY PCR: NEGATIVE

## 2015-09-12 LAB — PROCALCITONIN: Procalcitonin: 1.97 ng/mL

## 2015-09-12 LAB — LACTIC ACID, PLASMA: LACTIC ACID, VENOUS: 8 mmol/L — AB (ref 0.5–2.0)

## 2015-09-12 MED ORDER — SODIUM CHLORIDE 0.9 % IV SOLN
INTRAVENOUS | Status: DC
  Administered 2015-09-12: 02:00:00 via INTRAVENOUS

## 2015-09-12 MED ORDER — SODIUM POLYSTYRENE SULFONATE 15 GM/60ML PO SUSP
45.0000 g | Freq: Once | ORAL | Status: AC
  Administered 2015-09-12: 45 g via RECTAL
  Filled 2015-09-12: qty 180

## 2015-09-12 MED ORDER — CALCIUM GLUCONATE 10 % IV SOLN
1.0000 g | Freq: Once | INTRAVENOUS | Status: AC
  Administered 2015-09-13: 1 g via INTRAVENOUS
  Filled 2015-09-12: qty 10

## 2015-09-12 MED ORDER — INSULIN ASPART 100 UNIT/ML ~~LOC~~ SOLN
0.0000 [IU] | Freq: Three times a day (TID) | SUBCUTANEOUS | Status: DC
  Administered 2015-09-12: 3 [IU] via SUBCUTANEOUS
  Administered 2015-09-13: 2 [IU] via SUBCUTANEOUS
  Administered 2015-09-13: 1 [IU] via SUBCUTANEOUS

## 2015-09-12 MED ORDER — SODIUM CHLORIDE 0.9 % IV SOLN
1000.0000 mL | INTRAVENOUS | Status: DC
  Administered 2015-09-13: 1000 mL via INTRAVENOUS

## 2015-09-12 MED ORDER — INSULIN GLARGINE 100 UNIT/ML ~~LOC~~ SOLN
10.0000 [IU] | Freq: Every day | SUBCUTANEOUS | Status: DC
  Administered 2015-09-12: 10 [IU] via SUBCUTANEOUS
  Filled 2015-09-12 (×4): qty 0.1

## 2015-09-12 MED ORDER — DEXTROSE 50 % IV SOLN
1.0000 | Freq: Once | INTRAVENOUS | Status: AC
  Administered 2015-09-13: 50 mL via INTRAVENOUS
  Filled 2015-09-12: qty 50

## 2015-09-12 MED ORDER — SODIUM CHLORIDE 0.9 % IV SOLN
INTRAVENOUS | Status: AC
  Administered 2015-09-12 (×3): via INTRAVENOUS

## 2015-09-12 MED ORDER — INSULIN ASPART 100 UNIT/ML IV SOLN
10.0000 [IU] | Freq: Once | INTRAVENOUS | Status: AC
  Administered 2015-09-13: 10 [IU] via INTRAVENOUS

## 2015-09-12 MED ORDER — SODIUM POLYSTYRENE SULFONATE 15 GM/60ML PO SUSP
45.0000 g | Freq: Once | ORAL | Status: DC
  Filled 2015-09-12: qty 180

## 2015-09-12 MED ORDER — SODIUM CHLORIDE 0.9 % IV BOLUS (SEPSIS): 250.0000 mL | INTRAVENOUS | Status: DC | PRN

## 2015-09-12 NOTE — Progress Notes (Signed)
   Subjective: Patient with limited communication, responds "yes sir" to questions.  Objective: Vital signs in last 24 hours: Filed Vitals:   09/12/15 0900 09/12/15 1242 09/12/15 1400 09/12/15 1612  BP: 101/34 101/58 94/48 101/82  Pulse: 81 87 89 88  Temp:  97.4 F (36.3 C)  98 F (36.7 C)  TempSrc:  Oral  Oral  Resp: 13 23 14 13   Weight:      SpO2: 100% 99% 95% 92%   Weight change:   Intake/Output Summary (Last 24 hours) at 09/12/15 1633 Last data filed at 09/12/15 1611  Gross per 24 hour  Intake 1007.5 ml  Output    500 ml  Net  507.5 ml   General: resting in bed Cardiac: RRR, no rubs, murmurs or gallops Pulm: clear to auscultation bilaterally Abd: soft, nontender, nondistended Ext: right AKA, left BKA Neuro: alert, disoriented, moves upper extremities and changes position in bed   Assessment/Plan: Principal Problem:   Sepsis with multiple organ dysfunction (MOD) (HCC) Active Problems:   Dementia   History of stroke   S/P bilateral above knee amputation (HCC)   Diabetes mellitus (HCC)   NSTEMI (non-ST elevated myocardial infarction) (HCC)  69 year old male with PMH of CAD s/p MI x2, ischemic cardiomyopathy (EF 35% in Dec 2015), PVD s/p b/l LE amputations, T2DM, vascular dementia, and LLL adenocarcinoma s/p radiation treatment who presented with confusion, hypotension, elevated LFTs (AST/ALT 2584/2249), lactic acidosis, hyperkalemia, elevated Troponin and BNP, and acute on chronic renal insufficiency.  Sepsis with multiple organ dysfunction: Patient afebrile with blood pressures between 90-100s systolic, O2 sat low 90s on 3L [place]. Multiorgan dysfunction as above. Patient's family wishes for medical management/conservative care with IV fluids and antibiotics.  -Continue Vancomycin and Zosyn -Continue NS 150 cc/hr, monitor I/O given CHF -f/u blood cultures -f/u lactic acid -hold antihypertensives -monitor vitals  NSTEMI: Troponin 8.49, ST depressions on EKG in leads  I, aVL, V4-6 -Cardiology following, appreciate recommendations -Holding ACEI and Beta Blocker -Continue ASA  Hyperkalemia: Elevated at 6.5 this morning, given Calcium and Insulin in the ED. -Kayexalate 45 g rectal -monitor BMP  T2DM: -SSI-Sensitive -Lantus 10 units qhs  Dispo: Disposition is deferred at this time, poor prognosis.     The patient does have transportation limitations that hinder transportation to clinic appointments.     LOS: 0 days   Darreld McleanVishal Patel, MD 09/12/2015, 4:33 PM

## 2015-09-12 NOTE — Progress Notes (Signed)
This RN paged IMTS concerning the patients elevated CBG and K+. No call was ever returned and the day shift nurse was informed of the situation.

## 2015-09-12 NOTE — H&P (Signed)
Date: 09/12/2015               Patient Name:  Gary Wagner MRN: [id number]  DOB: 01/01/1946 Age / Sex: 69 y.o., male   PCP: No Pcp Per Patient         Medical Service: Internal Medicine Teaching Service         Attending Physician: Dr. Annia Belt, MD    First Contact: Dr. Zada Finders Pager: [phone]  Second Contact: Dr. Dellia Nims Pager: [phone]       After Hours (After 5p/  First Contact Pager: [phone]  weekends / holidays): Second Contact Pager: [phone]   Chief Complaint: Sepsis  History of Present Illness: Gary Wagner is a 69 year old African American gentleman with an extensive past medical history including: ischemic cardiomyopathy with an ejection fraction of 35% in December 2015, coronary artery disease status-post myocardial infarction in 1998 and 1999, peripheral vascular disease status-post left below-knee amputation and right above-knee amputation, type 2 diabetes, vascular dementia, left lower lobe adenocarcinoma status-post radiation treatment, presenting with confusion. The patient was unable to respond to the entire history was obtained via chart review.  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 Three days ago, he became constipated, was given laxatives, and subsequently became incontinent of stool. Today, he was reportedly much more confused and less interactive than usual.  In the emergency department, he was afebrile but hypotensive to 90/60, pulse 82, saturating 98% on room air. CMP was notable for potassium 6.8, creatinine 3.25, BUN 105, Alk-P 375, AST 2500, ALT 2200, bilirubin 5.5. CBC was notable for leukocytosis of 18.1, and hemoglobin of 8.1. Troponin was 8.5, BNP was >4500, lactic acid was 8.5, INR 2.42. EKG showed normal sinus rhythm with prolonged QT, peaked T wakes, ST depression in lateral leads, Q waves in inferior leads. Chest x-ray showed no acute process and urinalysis was normal.    Meds: Current Facility-Administered Medications  Medication Dose Route Frequency Provider Last Rate Last Dose  . 0.9 %  sodium chloride infusion   Intravenous Continuous Jones Bales, MD 150 mL/hr at 09/12/15 0137    . 0.9 %  sodium chloride infusion  1,000 mL Intravenous Continuous Jones Bales, MD      . piperacillin-tazobactam (ZOSYN) IVPB 2.25 g  2.25 g Intravenous 3 times per day Franky Macho, RPH      . sodium chloride 0.9 % bolus 500 mL  500 mL Intravenous Once Rahul P Desai, PA-C      . sodium polystyrene (KAYEXALATE) 15 GM/60ML suspension 30 g  30 g Rectal Once Rahul P Desai, PA-C      . [START ON 09/13/2015] vancomycin (VANCOCIN) IVPB 1000 mg/200 mL premix  1,000 mg Intravenous Q48H Franky Macho, RPH        Allergies: Allergies as of 08/24/2015  . (No Known Allergies)   Past Medical History  Diagnosis Date  . Dementia   . Diabetes mellitus without complication ([place])    History reviewed. No pertinent past surgical history. History reviewed. No pertinent family history. Social History   Social History  . Marital Status: Single    Spouse Name: N/A  . Number of Children: N/A  . Years of Education: N/A   Occupational History  . Not on file.   Social History Main Topics  . Smoking status: Unknown If Ever Smoked  . Smokeless tobacco: Not on file  . Alcohol Use: Not on file  .  Drug Use: Not on file  . Sexual Activity: Not on file   Other Topics Concern  . Not on file   Social History Narrative   Review of Systems  Unable to perform ROS: patient nonverbal   Physical Exam: Blood pressure 93/44, pulse 94, temperature 97.4 F (36.3 C), temperature source Oral, resp. rate 17, weight 59.8 kg (131 lb 13.4 oz), SpO2 100 %. General: sickly appearing African American man writhing in bed trying to get out HEENT: scleral icterus, extra-ocular muscles intact, oropharynx without lesions Cardiac: tachycardic with normal rhythm, no rubs, murmurs or gallops Pulm:  breathing well, clear to auscultation bilaterally Abd: bowel sounds normal, soft, nondistended, non-tender Ext: right above knee amputation, left below knee amputation, but warm and well perfused, without pedal edema Lymph: no cervical or supraclavicular lymphadenopathy Skin: no rash, hair, or nail changes Neuro: alert and oriented X3, cranial nerves II-XII grossly intact, moving all extremities well  Lab results: Basic Metabolic Panel:  Recent Labs  08/26/2015 1930 09/10/2015 1939  NA 138 136  K 6.8* 6.4*  CL 101 100*  CO2 18*  --   GLUCOSE 167* 161*  BUN 105* 99*  CREATININE 3.25* 3.30*  CALCIUM 7.8*  --    Liver Function Tests:  Recent Labs  09/18/2015 1930  AST 2584*  ALT 2249*  ALKPHOS 375*  BILITOT 5.5*  PROT 6.4*  ALBUMIN 2.7*    Recent Labs  08/30/2015 1930  LIPASE 61*    Recent Labs  09/09/2015 2030  AMMONIA 35   CBC:  Recent Labs  08/31/2015 1930 09/01/2015 1939  WBC 18.1*  --   NEUTROABS 14.5*  --   HGB 8.1* 9.2*  HCT 26.3* 27.0*  MCV 89.8  --   PLT 141*  --    CBG:  Recent Labs  09/08/2015 1735  GLUCAP 133*   Coagulation:  Recent Labs  08/22/2015 1930  LABPROT 26.1*  INR 2.43*   Urinalysis:  Recent Labs  08/30/2015 1945  COLORURINE AMBER*  LABSPEC 1.021  PHURINE 5.0  GLUCOSEU NEGATIVE  HGBUR SMALL*  BILIRUBINUR MODERATE*  KETONESUR 15*  PROTEINUR 30*  NITRITE NEGATIVE  LEUKOCYTESUR SMALL*   Imaging results:  Dg Chest Port 1 View  08/26/2015  CLINICAL DATA:  69 year old male with altered mental status EXAM: PORTABLE CHEST 1 VIEW COMPARISON:  None. FINDINGS: Mild cardiomegaly with left heart enlargement. Atherosclerotic calcifications present in the transverse aorta. The patient is rotated to the left which distorts the cardiac and mediastinal contours. Mediastinal contours are grossly within normal limits given this distortion. Low inspiratory volumes with mild bibasilar atelectasis. No pulmonary edema, pleural effusion or  pneumothorax. No focal airspace consolidation. No acute osseous abnormality. IMPRESSION: 1. Low inspiratory volumes with mild bibasilar atelectasis. 2. Borderline cardiomegaly. 3. Aortic atherosclerosis. Electronically Signed   By: Jacqulynn Cadet M.D.   On: 09/06/2015 18:12    Assessment & Plan by Problem: Mr. Bram is a 69 year old man with a complex vascular medical history including ischemic cardiomyopathy with an ejection fraction of 35% presenting with confusion from sepsis with multi-organ failure, including NSTEMI, shock liver, acute liver failure, acute renal failure, and hyperkalemia. His sepsis may be from a gastrointestinal source given the only localizing symptom obtained from chart review was diarrhea although his abdominal exam is normal. We'll also re-check another chest x-ray in the morning to look for a pneumonia once he's hydrated. Both sepsis and the numerous metabolic derangements are contributing to his altered mental status. Dr. Nelda Marseille, intensivist,  spoke with Mr. Class daughter who preferred to make her father DNR and only pursue treatment with IV antibiotics and fluids, as pressors would likely precipitate an arrhythmia given his extensive cardiac history. His status is tenuous and will his prognosis will likely be decided within the next 24 hours.   Sepsis from unknown origin: Per above, he was bolused 2L in the emergency department and we will continue fluids at 150cc/hr with boluses as needed, along with vancomycin and Zosyn. We'll need to closely monitor for pulmonary edema. -Continue vancomycin and Zosyn -Continue NS at 150cc/hr -Trend lactic acids -Trend pro-calcitonins -Follow blood cultures -Holding all antihypertensives -Chest x-ray in the morning  NSTEMI: Per above, troponin was 8.5, with super-elevation likely from poor renal function. -Continue trending troponins -Aspirin 342m suppository given -Continue aspirin 86mdaily -EKG in the morning -TTE in the  morning  Shock liver: Per above, AST/ALT in 2500s, bilirubin 5.5, INR 2.4 -Continue hydration -Holding statin  Acute kidney injury with hyperkalemia: Per above, his creatinine is 3.3, above his baseline of 2.4, with a BUN of 105. He received IV calcium in the ED and got another dose of kayexalate suppository. He may need dialysis going forward depending on what happens. -BMP in the morning  Ischemic cardiomyopathy with reduced ejection fraction: Per above, EF was 35% in December 2015. We'll need to carefully monitor him for pulmonary edema. -Holding lisinopril, metoprolol  Type 2 diabetes: We'll continue ISS. -ISS  Depression: We'll hold his cymbalta as he is NPO. -Holding cymbalta  Dispo: Disposition is deferred at this time, awaiting improvement of current medical problems.   The patient does not know have a current PCP (No Pcp Per Patient) and [facility].  The patient does have transportation limitations that hinder transportation to clinic appointments.  Signed: KyLoleta ChanceMD 09/12/2015, 1:48 AM

## 2015-09-12 NOTE — Progress Notes (Signed)
Utilization review completed. Joshuajames Moehring, RN, BSN. 

## 2015-09-12 NOTE — ED Notes (Signed)
Admitting at bedside 

## 2015-09-12 NOTE — Progress Notes (Signed)
    Subjective:  Confused; cannot answer whether CP or dyspnea.   Objective:  Filed Vitals:   09/12/15 0124 09/12/15 0400 09/12/15 0500 09/12/15 0811  BP: 95/37 97/55  101/56  Pulse: 75   82  Temp: 97.5 F (36.4 C) 97.3 F (36.3 C)  97.9 F (36.6 C)  TempSrc: Axillary Axillary  Oral  Resp: 14 13  17   Weight:   59.6 kg (131 lb 6.3 oz)   SpO2: 88%   100%    Intake/Output from previous day:  Intake/Output Summary (Last 24 hours) at 09/12/15 0953 Last data filed at 09/12/15 0615  Gross per 24 hour  Intake  407.5 ml  Output    175 ml  Net  232.5 ml    Physical Exam: Physical exam: Well-developed well-nourished confused Skin is warm and dry.  HEENT is normal.  Neck is supple.  Chest is clear to auscultation with normal expansion.  Cardiovascular exam is regular rate and rhythm.  Abdominal exam nontender or distended. No masses palpated. Extremities s/p bilateral amputation neuro confused    Lab Results: Basic Metabolic Panel:  Recent Labs  29/56/2103/09/06 1930 01/30/15 1939 09/12/15 0549  NA 138 136 137  K 6.8* 6.4* 6.5*  CL 101 100* 102  CO2 18*  --  13*  GLUCOSE 167* 161* 230*  BUN 105* 99* 103*  CREATININE 3.25* 3.30* 3.03*  CALCIUM 7.8*  --  7.4*   CBC:  Recent Labs  01/30/15 1930 01/30/15 1939 09/12/15 0549  WBC 18.1*  --  20.5*  NEUTROABS 14.5*  --   --   HGB 8.1* 9.2* 8.1*  HCT 26.3* 27.0* 26.5*  MCV 89.8  --  90.8  PLT 141*  --  111*    Assessment/Plan:  69 year old male with multiple medical problems including diabetes mellitus, peripheral vascular disease status post bilateral amputation, coronary artery disease, ischemic cardiomyopathy, prior CVA, vascular dementia, previous adenocarcinoma of the left lower lobe treated with radiation therapy admitted with acute renal failure, liver failure and altered mental status. Troponin found to be elevated and cardiology asked to evaluate. Note electrocardiogram is concerning for severe coronary  disease and possible left main. 1 non-ST elevation myocardial infarction-the patient's troponin is elevated in the setting of multiple metabolic abnormalities including acute renal failure, hyperkalemia, transaminitis, hypotension and possible sepsis. He is not a candidate for aggressive cardiac evaluation at this point. Echocardiogram is pending to assess LV function. Continue aspirin. No ACE inhibitor or beta blocker given borderline blood pressure and renal insufficiency. No statin given elevated liver functions. Patient is not being treated with heparin as his INR is 2.43. 2 acute renal failure-No baseline lab values noted. Management per primary care. 3 acute liver failure 4 possible sepsis-antibiotics per primary care. 5 hyperkalemia-management per primary care. Prognosis appears poor. Gary MillersBrian Areliz Wagner 09/12/2015, 9:53 AM

## 2015-09-12 NOTE — ED Notes (Signed)
Pt attempting to climb out of bed.  Attempted to re-orient.  Pt continues to state "yes, yes", but unable to carry on a conversation.  Pt reaching for someone or something.

## 2015-09-12 NOTE — Progress Notes (Signed)
Inpatient Diabetes Program Recommendations  AACE/ADA: New Consensus Statement on Inpatient Glycemic Control (2015)  Target Ranges:  Prepandial:   less than 140 mg/dL      Peak postprandial:   less than 180 mg/dL (1-2 hours)      Critically ill patients:  140 - 180 mg/dL   Review of Glycemic Control  Results for Gary Wagner, Gary Wagner (MRN [id number]) as of 09/12/2015 07:40  Ref. Range Dec 09, 2014 17:35 09/12/2015 02:09 09/12/2015 06:11  Glucose-Capillary Latest Ref Range: 65-99 mg/dL 409133 (H) 811216 (H) 914216 (H)    Diabetes history: Type 2 Outpatient Diabetes medications: Lantus 25 units qday, Metformin 1000mg  bid Current orders for Inpatient glycemic control: none  Inpatient Diabetes Program Recommendations: Please consider starting the patient on  Lantus insulin 10 units qhs and Novolog 0-9 units tid with meals.  Susette RacerJulie Shaneice Barsanti, RN, BA, MHA, CDE Diabetes Coordinator Inpatient Diabetes Program  [phone] (Team Pager) [phone] [facility] Office) 09/12/2015 7:41 AM

## 2015-09-12 NOTE — Progress Notes (Signed)
Pt arrived to 2C09 from ED. Patient is alert but disoriented x 4. Vital signs are stable, HR 80, O2 Sat 98% on 2L [place], BP 100/53. Safety sitter is at bedside. Will continue to monitor.

## 2015-09-13 ENCOUNTER — Inpatient Hospital Stay (HOSPITAL_COMMUNITY): Payer: Medicare (Managed Care)

## 2015-09-13 DIAGNOSIS — N184 Chronic kidney disease, stage 4 (severe): Secondary | ICD-10-CM

## 2015-09-13 DIAGNOSIS — Z8673 Personal history of transient ischemic attack (TIA), and cerebral infarction without residual deficits: Secondary | ICD-10-CM

## 2015-09-13 DIAGNOSIS — IMO0002 Reserved for concepts with insufficient information to code with codable children: Secondary | ICD-10-CM | POA: Insufficient documentation

## 2015-09-13 DIAGNOSIS — Z794 Long term (current) use of insulin: Secondary | ICD-10-CM

## 2015-09-13 DIAGNOSIS — F0391 Unspecified dementia with behavioral disturbance: Secondary | ICD-10-CM

## 2015-09-13 DIAGNOSIS — Z89612 Acquired absence of left leg above knee: Secondary | ICD-10-CM

## 2015-09-13 DIAGNOSIS — E1122 Type 2 diabetes mellitus with diabetic chronic kidney disease: Secondary | ICD-10-CM

## 2015-09-13 DIAGNOSIS — R6520 Severe sepsis without septic shock: Secondary | ICD-10-CM

## 2015-09-13 DIAGNOSIS — Z89611 Acquired absence of right leg above knee: Secondary | ICD-10-CM

## 2015-09-13 LAB — BASIC METABOLIC PANEL
Anion gap: 15 (ref 5–15)
Anion gap: 16 — ABNORMAL HIGH (ref 5–15)
BUN: 127 mg/dL — AB (ref 6–20)
BUN: 123 mg/dL — AB (ref 6–20)
CALCIUM: 7.2 mg/dL — AB (ref 8.9–10.3)
CO2: 14 mmol/L — AB (ref 22–32)
CO2: 12 mmol/L — AB (ref 22–32)
CREATININE: 2.92 mg/dL — AB (ref 0.61–1.24)
Calcium: 7.2 mg/dL — ABNORMAL LOW (ref 8.9–10.3)
Chloride: 111 mmol/L (ref 101–111)
Chloride: 111 mmol/L (ref 101–111)
Creatinine, Ser: 3.08 mg/dL — ABNORMAL HIGH (ref 0.61–1.24)
GFR calc Af Amer: 22 mL/min — ABNORMAL LOW (ref >60)
GFR calc non Af Amer: 20 mL/min — ABNORMAL LOW (ref >60)
GFR, EST AFRICAN AMERICAN: 24 mL/min — AB (ref >60)
GFR, EST NON AFRICAN AMERICAN: 19 mL/min — AB (ref >60)
GLUCOSE: 174 mg/dL — AB (ref 65–99)
Glucose, Bld: 151 mg/dL — ABNORMAL HIGH (ref 65–99)
POTASSIUM: 6.9 mmol/L — AB (ref 3.5–5.1)
Potassium: 7.2 mmol/L (ref 3.5–5.1)
SODIUM: 140 mmol/L (ref 135–145)
Sodium: 139 mmol/L (ref 135–145)

## 2015-09-13 LAB — COMPREHENSIVE METABOLIC PANEL
ALK PHOS: 293 U/L — AB (ref 38–126)
ALT: 1648 U/L — AB (ref 17–63)
AST: 1344 U/L — AB (ref 15–41)
Albumin: 2.4 g/dL — ABNORMAL LOW (ref 3.5–5.0)
Anion gap: 13 (ref 5–15)
BUN: 112 mg/dL — ABNORMAL HIGH (ref 6–20)
CHLORIDE: 109 mmol/L (ref 101–111)
CO2: 18 mmol/L — ABNORMAL LOW (ref 22–32)
Calcium: 7.3 mg/dL — ABNORMAL LOW (ref 8.9–10.3)
Creatinine, Ser: 3.12 mg/dL — ABNORMAL HIGH (ref 0.61–1.24)
GFR, EST AFRICAN AMERICAN: 22 mL/min — AB (ref >60)
GFR, EST NON AFRICAN AMERICAN: 19 mL/min — AB (ref >60)
Glucose, Bld: 239 mg/dL — ABNORMAL HIGH (ref 65–99)
POTASSIUM: 5.6 mmol/L — AB (ref 3.5–5.1)
SODIUM: 140 mmol/L (ref 135–145)
Total Bilirubin: 9.4 mg/dL — ABNORMAL HIGH (ref 0.3–1.2)
Total Protein: 5.6 g/dL — ABNORMAL LOW (ref 6.5–8.1)

## 2015-09-13 LAB — GLUCOSE, CAPILLARY
GLUCOSE-CAPILLARY: 266 mg/dL — AB (ref 65–99)
GLUCOSE-CAPILLARY: 134 mg/dL — AB (ref 65–99)
GLUCOSE-CAPILLARY: 120 mg/dL — AB (ref 65–99)
Glucose-Capillary: 166 mg/dL — ABNORMAL HIGH (ref 65–99)
Glucose-Capillary: 53 mg/dL — ABNORMAL LOW (ref 65–99)
Glucose-Capillary: 113 mg/dL — ABNORMAL HIGH (ref 65–99)

## 2015-09-13 LAB — CBC
HCT: 24.8 % — ABNORMAL LOW (ref 39.0–52.0)
HEMOGLOBIN: 7.5 g/dL — AB (ref 13.0–17.0)
MCH: 28.1 pg (ref 26.0–34.0)
MCHC: 30.2 g/dL (ref 30.0–36.0)
MCV: 92.9 fL (ref 78.0–100.0)
PLATELETS: 109 10*3/uL — AB (ref 150–400)
RBC: 2.67 MIL/uL — AB (ref 4.22–5.81)
RDW: 18.1 % — ABNORMAL HIGH (ref 11.5–15.5)
WBC: 30.3 10*3/uL — AB (ref 4.0–10.5)

## 2015-09-13 LAB — PROCALCITONIN: Procalcitonin: 2.17 ng/mL

## 2015-09-13 LAB — LACTIC ACID, PLASMA: LACTIC ACID, VENOUS: 4.8 mmol/L — AB (ref 0.5–2.0)

## 2015-09-13 MED ORDER — HYDROMORPHONE HCL 1 MG/ML IJ SOLN: 0.5000 mg | Freq: Once | INTRAMUSCULAR | Status: DC | PRN

## 2015-09-13 MED ORDER — HEPARIN SODIUM (PORCINE) 5000 UNIT/ML IJ SOLN
5000.0000 [IU] | Freq: Three times a day (TID) | INTRAMUSCULAR | Status: DC
  Administered 2015-09-13 (×2): 5000 [IU] via SUBCUTANEOUS
  Filled 2015-09-13 (×2): qty 1

## 2015-09-13 MED ORDER — CETYLPYRIDINIUM CHLORIDE 0.05 % MT LIQD
7.0000 mL | Freq: Two times a day (BID) | OROMUCOSAL | Status: DC
  Administered 2015-09-13 (×2): 7 mL via OROMUCOSAL

## 2015-09-13 MED ORDER — SODIUM CHLORIDE 0.9 % IV SOLN: INTRAVENOUS | Status: DC

## 2015-09-13 MED ORDER — DEXTROSE 50 % IV SOLN
INTRAVENOUS | Status: AC
  Administered 2015-09-13: 25 mL
  Filled 2015-09-13: qty 50

## 2015-09-13 MED ORDER — CHLORHEXIDINE GLUCONATE 0.12 % MT SOLN
15.0000 mL | Freq: Two times a day (BID) | OROMUCOSAL | Status: DC
  Administered 2015-09-13 (×2): 15 mL via OROMUCOSAL

## 2015-09-13 MED ORDER — DEXTROSE-NACL 5-0.45 % IV SOLN
INTRAVENOUS | Status: DC
  Administered 2015-09-13: 22:00:00 via INTRAVENOUS

## 2015-09-13 NOTE — Progress Notes (Signed)
Inpatient Diabetes Program Recommendations  AACE/ADA: New Consensus Statement on Inpatient Glycemic Control (2015)  Target Ranges:  Prepandial:   less than 140 mg/dL      Peak postprandial:   less than 180 mg/dL (1-2 hours)      Critically ill patients:  140 - 180 mg/dL  Results for Hamilton CapriWOLFE, Baltazar (MRN [id number]) as of 09/13/2015 10:18  Ref. Range 09/12/2015 08:06 09/12/2015 16:02 09/12/2015 21:58 09/13/2015 01:03 09/13/2015 08:34  Glucose-Capillary Latest Ref Range: 65-99 mg/dL 425213 (H) 956226 (H) 387199 (H) 266 (H) 166 (H)   Review of Glycemic Control  Current orders for Inpatient glycemic control: Lantus 10 units QHS, Novolog 0-9 units TID with meals  Inpatient Diabetes Program Recommendations: Correction (SSI): Please consider ordering Novolog bedtime correction scale.  Thanks, Orlando PennerMarie Ruairi Stutsman, RN, MSN, CDE Diabetes Coordinator Inpatient Diabetes Program [phone] (Team Pager from 8am to 5pm) [phone] ([facility] office) [phone] [facility] office) [phone] [facility] office)

## 2015-09-13 NOTE — Progress Notes (Signed)
Was paged by nurse Adelina MingsKelsey that patient appeared to be in pain and she requested morphine 1mg  q4hr.    I went to evaluate Gary Wagner and he was lying in bed and resting comfortably.  He was not agitated nor did he appear to be in any pain.  He occasionally mumbles something but this has been his baseline since admission.  I do not see any acute change.  I think it would be detrimental to Gary Wagner if we would give any morphine at this point given his soft pressures.  We are continuing IVF and abx at this point per the family's wishes.  Boykin PeekJacquelyn Lometa Riggin, MD

## 2015-09-13 NOTE — Progress Notes (Signed)
Hypoglycemic Event  CBG: 53  Treatment: 1/2 amp of dextrose  Symptoms: confusion?  Follow-up CBG: Time: 2135  CBG Result: 113  Possible Reasons for Event: NPO, no dextrose fluids  Comments/MD notified: Internal Medicine paged at 2136    Rosamaria LintsGarcia, Novalie Leamy A

## 2015-09-13 NOTE — Progress Notes (Signed)
   Subjective: Patient with limited communication, disoriented. Objective: Vital signs in last 24 hours: Filed Vitals:   09/12/15 2354 09/13/15 0433 09/13/15 0500 09/13/15 0803  BP: 91/72 107/61  108/96  Pulse: 88 92  90  Temp: 96.9 F (36.1 C) 98.1 F (36.7 C)  98 F (36.7 C)  TempSrc: Axillary Oral  Axillary  Resp: 19 20  13   Weight:   134 lb 4.2 oz (60.9 kg)   SpO2: 100%   95%   Weight change: 2 lb 6.8 oz (1.1 kg)  Intake/Output Summary (Last 24 hours) at 09/13/15 1152 Last data filed at 09/13/15 1140  Gross per 24 hour  Intake 1766.67 ml  Output    575 ml  Net 1191.67 ml   General: resting in bed Cardiac: RRR, no rubs, murmurs or gallops Pulm: clear to auscultation bilaterally Abd: soft, nontender, nondistended Ext: right AKA, left BKA Neuro: alert, disoriented, moves upper extremities and changes position in bed   Assessment/Plan: Principal Problem:   Sepsis with multiple organ dysfunction (MOD) (HCC) Active Problems:   Dementia   History of stroke   S/P bilateral above knee amputation (HCC)   Diabetes mellitus (HCC)   NSTEMI (non-ST elevated myocardial infarction) (HCC)  69 year old male with PMH of CAD s/p MI x2, ischemic cardiomyopathy (EF 35% in Dec 2015), PVD s/p b/l LE amputations, T2DM, vascular dementia, and LLL adenocarcinoma s/p radiation treatment who presented with confusion, hypotension, elevated LFTs (AST/ALT 2584/2249), lactic acidosis, hyperkalemia, elevated Troponin and BNP, and acute on chronic renal insufficiency.  Sepsis with multiple organ dysfunction: No infection source identified, patient afebrile with increasing leukocytosis. BUN >100, uremia with probable cause of patient's symptoms. Patient's family wishes for medical management/conservative care with IV fluids and antibiotics. He is not a great candidate for long-term dialysis. Patient is producing urine with foley catheter, hopefully will show some improvement.  -Continue Vancomycin  and Zosyn -Continue NS 100 cc/hr for 12 hrs, monitor I/O given CHF -f/u blood cultures --> NGTD -f/u lactic acid -->4.8 -hold antihypertensives -monitor vitals  NSTEMI: Troponin 8.49, ST depressions on EKG in leads I, aVL, V4-6. Not a candidate for cath. -Appreciate Cardiology recommendations -Holding ACEI and Beta Blocker -Continue ASA  Hyperkalemia: Elevated at 6.6 last night, given Calcium, Insulin, and Kayexalate. Improved to 5.6 this morning.  -monitor BMP  T2DM: -SSI-Sensitive -Lantus 10 units qhs  Dispo: Disposition is deferred at this time, poor prognosis.     The patient does have transportation limitations that hinder transportation to clinic appointments.     LOS: 1 day   Darreld McleanVishal Rashay Barnette, MD 09/13/2015, 11:52 AM

## 2015-09-13 NOTE — Progress Notes (Signed)
Pharmacist Heart Failure Core Measure Documentation  Assessment: Gary Wagner has an EF documented as 35% on 09/2014.  Rationale: Heart failure patients with left ventricular systolic dysfunction (LVSD) and an EF < 40% should be prescribed an angiotensin converting enzyme inhibitor (ACEI) or angiotensin receptor blocker (ARB) at discharge unless a contraindication is documented in the medical record.  This patient is not currently on an ACEI or ARB for HF.  This note is being placed in the record in order to provide documentation that a contraindication to the use of these agents is present for this encounter.  ACE Inhibitor or Angiotensin Receptor Blocker is contraindicated (specify all that apply)  []   ACEI allergy AND ARB allergy []   Angioedema []   Moderate or severe aortic stenosis []   Hyperkalemia [x]   Hypotension []   Renal artery stenosis [x]   Worsening renal function, preexisting renal disease or dysfunction  Cardiology has noted to continue holding PTA lisinopril.  [name] 09/13/2015 12:19 PM

## 2015-09-13 NOTE — Clinical Social Work Note (Signed)
CSW received consult for SNF placement. Notes reviewed and per MD progress note on 11/22, "disposition is deferred at this time, poor prognosis".  CSW will continue to follow and assist with discharge disposition as needed.   Genelle BalVanessa Chrisann Melaragno, MSW, LCSW Licensed Clinical Social Worker Clinical Social Work Department [facility] [phone]

## 2015-09-13 NOTE — Progress Notes (Signed)
    Subjective:  Confused; cannot answer whether CP or dyspnea.   Objective:  Filed Vitals:   09/12/15 2100 09/12/15 2354 09/13/15 0433 09/13/15 0500  BP: 107/70 91/72 107/61   Pulse: 98 88 92   Temp:  96.9 F (36.1 C) 98.1 F (36.7 C)   TempSrc:  Axillary Oral   Resp: 17 19 20    Weight:    60.9 kg (134 lb 4.2 oz)  SpO2: 96% 100%      Intake/Output from previous day:  Intake/Output Summary (Last 24 hours) at 09/13/15 0754 Last data filed at 09/13/15 0600  Gross per 24 hour  Intake   1650 ml  Output    575 ml  Net   1075 ml    Physical Exam: Physical exam: Well-developed confused Skin is warm and dry.  HEENT is normal.  Neck is supple.  Chest is CTA anteriorly.  Cardiovascular exam is regular rate and rhythm.  Abdominal exam nontender or distended. No masses palpated. Extremities s/p bilateral amputation neuro confused    Lab Results: Basic Metabolic Panel:  Recent Labs  16/07/9610/22/16 1914 09/13/15 0605  NA 138 140  K 6.6* 5.6*  CL 106 109  CO2 12* 18*  GLUCOSE 269* 239*  BUN 110* 112*  CREATININE 3.19* 3.12*  CALCIUM 7.1* 7.3*   CBC:  Recent Labs  Sep 12, 2015 1930  09/12/15 0549 09/13/15 0605  WBC 18.1*  --  20.5* 30.3*  NEUTROABS 14.5*  --   --   --   HGB 8.1*  < > 8.1* 7.5*  HCT 26.3*  < > 26.5* 24.8*  MCV 89.8  --  90.8 92.9  PLT 141*  --  111* 109*  < > = values in this interval not displayed.  Assessment/Plan:  69 year old male with multiple medical problems including diabetes mellitus, peripheral vascular disease status post bilateral amputation, coronary artery disease, ischemic cardiomyopathy, prior CVA, vascular dementia, previous adenocarcinoma of the left lower lobe treated with radiation therapy admitted with acute renal failure, liver failure and altered mental status. Troponin found to be elevated and cardiology asked to evaluate. Note electrocardiogram is concerning for severe coronary disease and possible left main. 1 non-ST  elevation myocardial infarction-the patient's troponin is elevated in the setting of multiple metabolic abnormalities including acute renal failure, hyperkalemia, transaminitis, hypotension and possible sepsis. He is not a candidate for aggressive cardiac evaluation at this point. Echocardiogram is pending to assess LV function. Continue aspirin. No ACE inhibitor or beta blocker given borderline blood pressure and renal insufficiency. Can add low dose beta blocker later if BP allows. No statin given elevated liver functions. Patient is not being treated with heparin as his INR is elevated. 2 acute renal failure- Management per primary care. 3 acute liver failure 4 possible sepsis-antibiotics per primary care. 5 hyperkalemia-management per primary care. Prognosis appears poor. We will sign off. Please call with questions; if he makes significant improvement we could consider further cardiac evaluation in the future. Olga MillersBrian Crenshaw 09/13/2015, 7:54 AM

## 2015-09-13 NOTE — Progress Notes (Signed)
CRITICAL VALUE ALERT  Critical value received:  Lactic Acid 4.8  Date of notification:  09/13/2015  Time of notification:  0730  Critical value read back:Yes.    Nurse who received alert:  Malachy Moanhristopher Woodard, RN/ Burnett HarryStefanie Matheo Rathbone, RN  MD notified (1st page):  Dr. Allena KatzPatel  Time of first page:  0803  MD notified (2nd page):  Time of second page:  Responding MD:  Dr. Allena KatzPatel  Time MD responded:  [phone]

## 2015-09-14 DIAGNOSIS — I472 Ventricular tachycardia: Secondary | ICD-10-CM

## 2015-09-16 LAB — CULTURE, BLOOD (ROUTINE X 2)
CULTURE: NO GROWTH
Culture: NO GROWTH

## 2015-09-17 DIAGNOSIS — I472 Ventricular tachycardia, unspecified: Secondary | ICD-10-CM | POA: Insufficient documentation

## 2015-09-17 DIAGNOSIS — I4729 Other ventricular tachycardia: Secondary | ICD-10-CM | POA: Insufficient documentation

## 2015-09-21 NOTE — Discharge Summary (Signed)
  Name: Gary CapriJohn Wagner MRN: [id number] DOB: 1946-09-10 69 y.o.  Date of Admission: 09/02/2015  5:32 PM Date of Discharge: 07/14/15 Attending Physician: Dr. Cyndie ChimeGranfortuna  Discharge Diagnosis: Principal Problem:   Sepsis with multiple organ dysfunction (MOD) (HCC) Active Problems:   Dementia   History of stroke   S/P bilateral above knee amputation (HCC)   Diabetes mellitus (HCC)   NSTEMI (non-ST elevated myocardial infarction) (HCC)   Multisystem organ failure   Cause of death: Sepsis with multiple organ dysfunction leading to cardiac arrest Time of death: 0239  Disposition and follow-up:   [name] [facility].    Hospital Course: Patient presented presented with confusion, hypotension, elevated LFTs (AST/ALT 2584/2249), lactic acidosis, hyperkalemia, elevated Troponin and BNP, and acute on chronic renal insufficiency. EKG with ST depression changes consistent with NSTEMI. He was septic with multiorgan dysfunction. Critical Care saw patient in the ED, and after discussions with family, decision was made to defer aggressive treatment. Instead patient was given DNR status and supported with conservative medical management with IV fluids, antibiotics, and medication. Cardiology also saw patient and determined catheterization was not a feasible option with such poor prognosis. Early morning, on November 24th, 2016, [name] was found to be in Vtach/Vfib, unresponsive, with a very faint pulse. Time of death was pronounced at 0239.  Signed: Darreld McleanVishal [place], MD 07/14/15, 12:51 PM

## 2015-09-21 NOTE — Progress Notes (Signed)
RN received an alert on phone at 0230 of Vtach for this pt.  Upon entering pts room pt was unresponsive, very faint pulse, Vtach/Vfib on the monitor. Pt is a DNR.  Time of death was pronounced by Rosamaria LintsKelsey Samadhi Mahurin, RN and Janice NorrieMisty Ennis, RN at [phone] via auscultation. Post mortem care has been done.  RN called daughter and she has just arrived at [id number]. RN will continue to be present for family.

## 2015-09-21 DEATH — deceased

## 2016-05-16 IMAGING — CR DG CHEST 1V PORT
1 series · 1 of 1 positions shown · non-contrast
Comparison: None.

CLINICAL DATA: 69-year-old male with altered mental status

EXAM:
PORTABLE CHEST 1 VIEW

[AP]
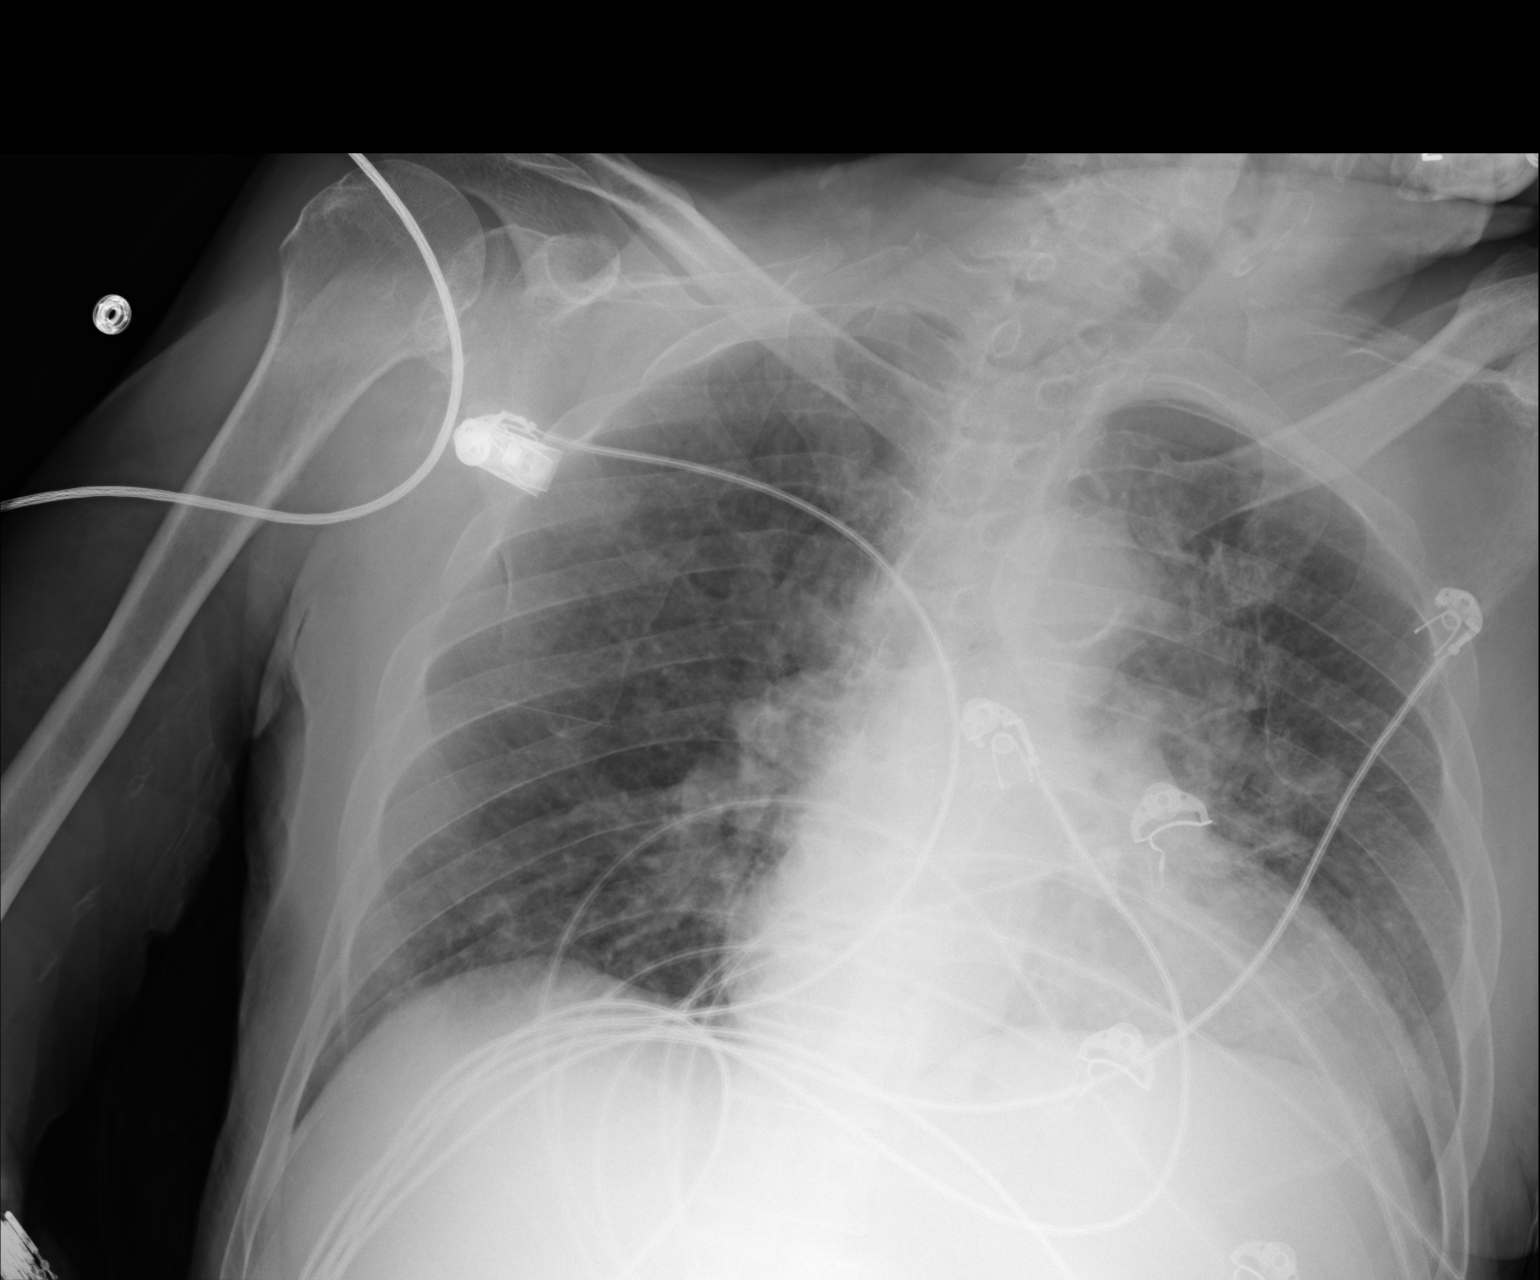

[1 of 1 positions shown; findings below may reference images not displayed]

FINDINGS: Mild cardiomegaly with left heart enlargement. Atherosclerotic
calcifications present in the transverse aorta. The patient is
rotated to the left which distorts the cardiac and mediastinal
contours. Mediastinal contours are grossly within normal limits
given this distortion. Low inspiratory volumes with mild bibasilar
atelectasis. No pulmonary edema, pleural effusion or pneumothorax.
No focal airspace consolidation. No acute osseous abnormality.
IMPRESSION: 1. Low inspiratory volumes with mild bibasilar atelectasis.
2. Borderline cardiomegaly.
3. Aortic atherosclerosis.
